# Patient Record
Sex: Female | Born: 1950 | Race: Black or African American | Hispanic: No | Smoking: Never smoker
Health system: Southern US, Community
[De-identification: ages and names within clinical notes are randomized; demographics above are authoritative.]

## PROBLEM LIST (undated history)

## (undated) DIAGNOSIS — E079 Disorder of thyroid, unspecified: Secondary | ICD-10-CM

## (undated) DIAGNOSIS — K219 Gastro-esophageal reflux disease without esophagitis: Secondary | ICD-10-CM

## (undated) DIAGNOSIS — Z86010 Personal history of colonic polyps: Secondary | ICD-10-CM

## (undated) HISTORY — DX: Personal history of colonic polyps: Z86.010

## (undated) HISTORY — PX: FOOT SURGERY: SHX648

## (undated) HISTORY — DX: Disorder of thyroid, unspecified: E07.9

## (undated) HISTORY — DX: Gastro-esophageal reflux disease without esophagitis: K21.9

## (undated) HISTORY — PX: OTHER SURGICAL HISTORY: SHX169

---

## 1997-12-12 ENCOUNTER — Encounter: Admission: RE | Admit: 1997-12-12 | Discharge: 1997-12-12 | Payer: Self-pay | Admitting: *Deleted

## 1999-06-11 ENCOUNTER — Encounter: Admission: RE | Admit: 1999-06-11 | Discharge: 1999-06-11 | Payer: Self-pay | Admitting: Family Medicine

## 1999-06-11 ENCOUNTER — Encounter: Payer: Self-pay | Admitting: Family Medicine

## 2000-10-15 ENCOUNTER — Encounter: Admission: RE | Admit: 2000-10-15 | Discharge: 2000-10-15 | Payer: Self-pay | Admitting: Family Medicine

## 2000-10-15 ENCOUNTER — Encounter: Payer: Self-pay | Admitting: Family Medicine

## 2001-09-30 ENCOUNTER — Encounter: Admission: RE | Admit: 2001-09-30 | Discharge: 2001-09-30 | Payer: Self-pay | Admitting: Family Medicine

## 2001-09-30 ENCOUNTER — Encounter: Payer: Self-pay | Admitting: Family Medicine

## 2009-04-12 ENCOUNTER — Emergency Department (HOSPITAL_BASED_OUTPATIENT_CLINIC_OR_DEPARTMENT_OTHER): Admission: EM | Admit: 2009-04-12 | Discharge: 2009-04-12 | Payer: Self-pay | Admitting: Emergency Medicine

## 2010-07-23 LAB — URINALYSIS, ROUTINE W REFLEX MICROSCOPIC
Bilirubin Urine: NEGATIVE
Glucose, UA: NEGATIVE mg/dL
Hgb urine dipstick: NEGATIVE
Ketones, ur: NEGATIVE mg/dL
Nitrite: NEGATIVE
Protein, ur: NEGATIVE mg/dL
Specific Gravity, Urine: 1.019 (ref 1.005–1.030)
Urobilinogen, UA: 1 mg/dL (ref 0.0–1.0)
pH: 7.5 (ref 5.0–8.0)

## 2010-07-23 LAB — URINE MICROSCOPIC-ADD ON

## 2010-07-23 LAB — URINE CULTURE: Colony Count: 5000

## 2011-04-23 HISTORY — PX: COLONOSCOPY: SHX174

## 2011-06-18 ENCOUNTER — Emergency Department (INDEPENDENT_AMBULATORY_CARE_PROVIDER_SITE_OTHER): Payer: BC Managed Care – PPO

## 2011-06-18 ENCOUNTER — Encounter (HOSPITAL_BASED_OUTPATIENT_CLINIC_OR_DEPARTMENT_OTHER): Payer: Self-pay | Admitting: *Deleted

## 2011-06-18 ENCOUNTER — Other Ambulatory Visit: Payer: Self-pay

## 2011-06-18 ENCOUNTER — Emergency Department (HOSPITAL_BASED_OUTPATIENT_CLINIC_OR_DEPARTMENT_OTHER)
Admission: EM | Admit: 2011-06-18 | Discharge: 2011-06-18 | Disposition: A | Discharge status: Home / Self Care | Payer: BC Managed Care – PPO | Attending: Emergency Medicine | Admitting: Emergency Medicine

## 2011-06-18 DIAGNOSIS — R091 Pleurisy: Secondary | ICD-10-CM

## 2011-06-18 DIAGNOSIS — R079 Chest pain, unspecified: Secondary | ICD-10-CM

## 2011-06-18 LAB — CARDIAC PANEL(CRET KIN+CKTOT+MB+TROPI)
CK, MB: 2.9 ng/mL (ref 0.3–4.0)
Total CK: 216 U/L — ABNORMAL HIGH (ref 7–177)
Troponin I: <0.3 ng/mL (ref <0.30)

## 2011-06-18 LAB — COMPREHENSIVE METABOLIC PANEL
ALT: 23 U/L (ref 0–35)
AST: 25 U/L (ref 0–37)
Alkaline Phosphatase: 60 U/L (ref 39–117)
CO2: 30 mEq/L (ref 19–32)
Calcium: 11.1 mg/dL — ABNORMAL HIGH (ref 8.4–10.5)
GFR calc Af Amer: >90 mL/min (ref >90)
Glucose, Bld: 85 mg/dL (ref 70–99)
Potassium: 4.2 mEq/L (ref 3.5–5.1)
Sodium: 139 mEq/L (ref 135–145)
Total Protein: 7.9 g/dL (ref 6.0–8.3)

## 2011-06-18 LAB — CBC
MCH: 29.3 pg (ref 26.0–34.0)
MCHC: 34 g/dL (ref 30.0–36.0)
MCV: 86.2 fL (ref 78.0–100.0)
Platelets: 278 10*3/uL (ref 150–400)
RDW: 12.9 % (ref 11.5–15.5)

## 2011-06-18 LAB — DIFFERENTIAL
Eosinophils Absolute: 0.1 10*3/uL (ref 0.0–0.7)
Metamyelocytes Relative: 0 %
Myelocytes: 0 %
Neutro Abs: 3.1 10*3/uL (ref 1.7–7.7)
Neutrophils Relative %: 48 % (ref 43–77)
Promyelocytes Absolute: 0 %
nRBC: 0 /100 WBC

## 2011-06-18 MED ORDER — ASPIRIN 81 MG PO CHEW
324.0000 mg | CHEWABLE_TABLET | Freq: Once | ORAL | Status: AC
  Administered 2011-06-18: 324 mg via ORAL
  Filled 2011-06-18: qty 1

## 2011-06-18 MED ORDER — OMEPRAZOLE 20 MG PO CPDR: 20.0000 mg | DELAYED_RELEASE_CAPSULE | Freq: Every day | ORAL | Status: DC

## 2011-06-18 MED ORDER — GI COCKTAIL ~~LOC~~
30.0000 mL | Freq: Once | ORAL | Status: AC
  Administered 2011-06-18: 30 mL via ORAL
  Filled 2011-06-18: qty 30

## 2011-06-18 NOTE — ED Notes (Signed)
Patient states she has had chest pain for the last 24 hours.  States she felt like she had indigestion and was burping a whole lot.  States this am she developed left neck and arm pain.

## 2011-06-18 NOTE — Discharge Instructions (Signed)
Chest Pain (Nonspecific) It is often hard to give a specific diagnosis for the cause of chest pain. There is always a chance that your pain could be related to something serious, such as a heart attack or a blood clot in the lungs. You need to follow up with your caregiver for further evaluation. CAUSES   Heartburn.   Pneumonia or bronchitis.   Anxiety and stress.   Inflammation around your heart (pericarditis) or lung (pleuritis or pleurisy).   A blood clot in the lung.   A collapsed lung (pneumothorax). It can develop suddenly on its own (spontaneous pneumothorax) or from injury (trauma) to the chest.  The chest wall is composed of bones, muscles, and cartilage. Any of these can be the source of the pain.  The bones can be bruised by injury.   The muscles or cartilage can be strained by coughing or overwork.   The cartilage can be affected by inflammation and become sore (costochondritis).  DIAGNOSIS  Lab tests or other studies, such as X-rays, an EKG, stress testing, or cardiac imaging, may be needed to find the cause of your pain.  TREATMENT   Treatment depends on what may be causing your chest pain. Treatment may include:   Acid blockers for heartburn.   Anti-inflammatory medicine.   Pain medicine for inflammatory conditions.   Antibiotics if an infection is present.   You may be advised to change lifestyle habits. This includes stopping smoking and avoiding caffeine and chocolate.   You may be advised to keep your head raised (elevated) when sleeping. This reduces the chance of acid going backward from your stomach into your esophagus.   Most of the time, nonspecific chest pain will improve within 2 to 3 days with rest and mild pain medicine.  HOME CARE INSTRUCTIONS   If antibiotics were prescribed, take the full amount even if you start to feel better.   For the next few days, avoid physical activities that bring on chest pain. Continue physical activities as  directed.   Do not smoke cigarettes or drink alcohol until your symptoms are gone.   Only take over-the-counter or prescription medicine for pain, discomfort, or fever as directed by your caregiver.   Follow your caregiver's suggestions for further testing if your chest pain does not go away.   Keep any follow-up appointments you made. If you do not go to an appointment, you could develop lasting (chronic) problems with pain. If there is any problem keeping an appointment, you must call to reschedule.  SEEK MEDICAL CARE IF:   You think you are having problems from the medicine you are taking. Read your medicine instructions carefully.   Your chest pain does not go away, even after treatment.   You develop a rash with blisters on your chest.  SEEK IMMEDIATE MEDICAL CARE IF:   You have increased chest pain or pain that spreads to your arm, neck, jaw, back, or belly (abdomen).   You develop shortness of breath, an increasing cough, or you are coughing up blood.   You have severe back or abdominal pain, feel sick to your stomach (nauseous) or throw up (vomit).   You develop severe weakness, fainting, or chills.   You have an oral temperature above 102 F (38.9 C), not controlled by medicine.  THIS IS AN EMERGENCY. Do not wait to see if the pain will go away. Get medical help at once. Call your local emergency services (911 in [place] Do not drive yourself to   the hospital. MAKE SURE YOU:   Understand these instructions.   Will watch your condition.   Will get help right away if you are not doing well or get worse.  Document Released: 01/16/2005 Document Revised: 12/19/2010 Document Reviewed: 11/12/2007 Exit[employer] Patient Information 2012 [employer]. 

## 2011-06-18 NOTE — ED Provider Notes (Signed)
History     CSN: [id number]  Arrival date & time 06/18/11  1324   First MD Initiated Contact with Patient 06/18/11 [phone]      Chief Complaint  Patient presents with  . Pleurisy    (Consider location/radiation/quality/duration/timing/severity/associated sxs/prior treatment) Patient is a 61 y.o. female presenting with chest pain. The history is provided by the patient.  Chest Pain The chest pain began yesterday. Chest pain occurs constantly. The chest pain is unchanged. Associated with: nothing. The severity of the pain is mild. The quality of the pain is described as pressure-like. The pain does not radiate. Exacerbated by: nothing. Associated symptoms comments: belching. She tried nothing for the symptoms. Risk factors include no known risk factors.     History reviewed. No pertinent past medical history.  Past Surgical History  Procedure Date  . Cesarean section     No family history on file.  History  Substance Use Topics  . Smoking status: Never Smoker   . Smokeless tobacco: Not on file  . Alcohol Use: No    OB History    Grav Para Term Preterm Abortions TAB SAB Ect Mult Living                  Review of Systems  Cardiovascular: Positive for chest pain.  All other systems reviewed and are negative.    Allergies  Review of patient's allergies indicates no known allergies.  Home Medications   Current Outpatient Rx  Name Route Sig Dispense Refill  . ONE-DAILY MULTI VITAMINS PO TABS Oral Take 1 tablet by mouth daily.      BP 129/86  Pulse 72  Temp 98.1 F (36.7 C)  Resp 20  Ht 5\' 4"  (1.626 m)  Wt 190 lb (86.183 kg)  BMI 32.61 kg/m2  SpO2 100%  Physical Exam  Nursing note and vitals reviewed. Constitutional: She is oriented to person, place, and time. She appears well-developed and well-nourished. No distress.  HENT:  Head: Normocephalic and atraumatic.  Neck: Normal range of motion. Neck supple.  Cardiovascular: Normal rate and regular rhythm.   Exam reveals no gallop and no friction rub.   No murmur heard.      There is ttp of the anterior chest wall which reproduces her symptoms.  Pulmonary/Chest: Effort normal and breath sounds normal. No respiratory distress. She has no wheezes.  Abdominal: Soft. Bowel sounds are normal. She exhibits no distension. There is no tenderness.  Musculoskeletal: Normal range of motion.  Neurological: She is alert and oriented to person, place, and time.  Skin: Skin is warm and dry. She is not diaphoretic.    ED Course  Procedures (including critical care time)   Labs Reviewed  CARDIAC PANEL(CRET KIN+CKTOT+MB+TROPI)  CBC  DIFFERENTIAL  COMPREHENSIVE METABOLIC PANEL  LIPASE, BLOOD  TROPONIN I   No results found.   No diagnosis found.   Date: 06/18/2011  Rate: 63  Rhythm: normal sinus rhythm  QRS Axis: left  Intervals: normal  ST/T Wave abnormalities: normal  Conduction Disutrbances:none  Narrative Interpretation:   Old EKG Reviewed: none available    MDM  The symptoms are atypical for cardiac pain, the enzymes and ekg are normal, and there are no risk factors for cardiac disease.  She is feeling better with GI cocktail, will discharge to home.  Return prn.          Geoffery Lyons, MD 06/18/11 1201

## 2011-10-07 ENCOUNTER — Ambulatory Visit (INDEPENDENT_AMBULATORY_CARE_PROVIDER_SITE_OTHER): Payer: BC Managed Care – PPO | Admitting: Family Medicine

## 2011-10-07 ENCOUNTER — Encounter: Payer: Self-pay | Admitting: *Deleted

## 2011-10-07 ENCOUNTER — Encounter: Payer: Self-pay | Admitting: Family Medicine

## 2011-10-07 VITALS — BP 115/75 | HR 75 | Temp 98.4°F | Ht 64.0 in | Wt 202.4 lb

## 2011-10-07 DIAGNOSIS — M799 Soft tissue disorder, unspecified: Secondary | ICD-10-CM

## 2011-10-07 DIAGNOSIS — E079 Disorder of thyroid, unspecified: Secondary | ICD-10-CM | POA: Insufficient documentation

## 2011-10-07 DIAGNOSIS — M7989 Other specified soft tissue disorders: Secondary | ICD-10-CM | POA: Insufficient documentation

## 2011-10-07 DIAGNOSIS — E669 Obesity, unspecified: Secondary | ICD-10-CM | POA: Insufficient documentation

## 2011-10-07 DIAGNOSIS — K219 Gastro-esophageal reflux disease without esophagitis: Secondary | ICD-10-CM | POA: Insufficient documentation

## 2011-10-07 DIAGNOSIS — Z23 Encounter for immunization: Secondary | ICD-10-CM

## 2011-10-07 LAB — HEPATIC FUNCTION PANEL
AST: 23 U/L (ref 0–37)
Albumin: 3.8 g/dL (ref 3.5–5.2)
Total Bilirubin: 0.4 mg/dL (ref 0.3–1.2)

## 2011-10-07 LAB — BASIC METABOLIC PANEL
BUN: 15 mg/dL (ref 6–23)
Calcium: 10.2 mg/dL (ref 8.4–10.5)
Creatinine, Ser: 0.8 mg/dL (ref 0.4–1.2)
GFR: 99.66 mL/min (ref >60.00)
Glucose, Bld: 78 mg/dL (ref 70–99)

## 2011-10-07 LAB — CBC WITH DIFFERENTIAL/PLATELET
Basophils Relative: 0.5 % (ref 0.0–3.0)
Eosinophils Absolute: 0.2 10*3/uL (ref 0.0–0.7)
Eosinophils Relative: 2.3 % (ref 0.0–5.0)
HCT: 35.1 % — ABNORMAL LOW (ref 36.0–46.0)
Lymphs Abs: 2.3 10*3/uL (ref 0.7–4.0)
MCHC: 33.2 g/dL (ref 30.0–36.0)
MCV: 89.4 fl (ref 78.0–100.0)
Monocytes Absolute: 0.6 10*3/uL (ref 0.1–1.0)
Neutro Abs: 3.9 10*3/uL (ref 1.4–7.7)
RBC: 3.92 Mil/uL (ref 3.87–5.11)
WBC: 7 10*3/uL (ref 4.5–10.5)

## 2011-10-07 LAB — LIPID PANEL
Cholesterol: 194 mg/dL (ref 0–200)
HDL: 45 mg/dL (ref >39.00)
Triglycerides: 47 mg/dL (ref 0.0–149.0)

## 2011-10-07 LAB — TSH: TSH: 1.63 u[IU]/mL (ref 0.35–5.50)

## 2011-10-07 MED ORDER — TETANUS-DIPHTH-ACELL PERTUSSIS 5-2.5-18.5 LF-MCG/0.5 IM SUSP
0.5000 mL | Freq: Once | INTRAMUSCULAR | Status: AC
  Administered 2011-10-07: 0.5 mL via INTRAMUSCULAR

## 2011-10-07 NOTE — Assessment & Plan Note (Signed)
New.  Pt's mass consistent w/ lipoma.  Not growing or changing- pt prefers watchful waiting to surgical referral at this time.

## 2011-10-07 NOTE — Assessment & Plan Note (Signed)
New to provider, chronic for pt.  Well controlled w/ dietary modifications and OTC prilosec prn.  Will follow.

## 2011-10-07 NOTE — Patient Instructions (Addendum)
Schedule your complete physical at your convenience- we'll do your pap We'll notify you of your lab results Keep up the good work on healthy diet and regular exercise Call with any questions or concerns Welcome!  We're glad to have you!

## 2011-10-07 NOTE — Assessment & Plan Note (Signed)
New to provider.  Pt reports years ago she was told that her thyroid was 'messed up'.  Has never been on meds, no recent labs.  Check labs s

## 2011-10-07 NOTE — Assessment & Plan Note (Signed)
New.  Pt's goal is to lose 40 lbs.  Has recently started to exercise.  Stressed importance of healthy diet.  Will follow.

## 2011-10-07 NOTE — Progress Notes (Signed)
  Subjective:    Patient ID: Deanna Livingston, female    DOB: [birth year], 61 y.o.   MRN: [id number]  HPI New to establish.  No recent PCP.  No GYN.  Thyroid disorder- pt reports that 'years ago' my 'thyroid was out of whack'.  Has not had recent lab work.  Denies current sxs- no fatigue, constipation, dry skin/hair/nails, cold intolerance.  GERD- chronic problem, only occurs w/ spicy foods.  Will take prilosec as needed.  Obesity- chronic problem for pt, pt wants to lose 40 lbs.  Pt is walking and doing elliptical.  Eats lots of fruits and veggies and drinks water.  Health maintenance- overdue on pap/mammo.  Has never had colonoscopy.  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  Not growing or changing.  Will occasionally cause pain.   Review of Systems     Objective:   Physical Exam  Vitals reviewed. Constitutional: She is oriented to person, place, and time. She appears well-developed and well-nourished. No distress.  HENT:  Head: Normocephalic and atraumatic.  Eyes: Conjunctivae and EOM are normal. Pupils are equal, round, and reactive to light.  Neck: Normal range of motion. Neck supple. No thyromegaly present.  Cardiovascular: Normal rate, regular rhythm, normal heart sounds and intact distal pulses.   No murmur heard. Pulmonary/Chest: Effort normal and breath sounds normal. No respiratory distress.  Abdominal: Soft. She exhibits no distension. There is no tenderness.  Musculoskeletal: She exhibits no edema (of lower extremities).       4-5 cm mobile, nontender soft tissue mass at L anterior glenohumeral joint  Lymphadenopathy:    She has no cervical adenopathy.  Neurological: She is alert and oriented to person, place, and time.  Skin: Skin is warm and dry.  Psychiatric: She has a normal mood and affect. Her behavior is normal.          Assessment & Plan:

## 2011-10-09 LAB — VITAMIN D 1,25 DIHYDROXY: Vitamin D 1, 25 (OH)2 Total: 111 pg/mL — ABNORMAL HIGH (ref 18–72)

## 2011-10-14 ENCOUNTER — Telehealth: Payer: Self-pay | Admitting: *Deleted

## 2011-10-14 NOTE — Telephone Encounter (Signed)
Pt states that she was advised to stop all Vitamin D supplement but would like to know if it is ok to take multivitamin because if does contain vitamin D. Per Dr Laury Axon ok for Pt to take multivitamin just no additional supplements of Vit-d. Pt ok info will just take the multivitamin.

## 2011-11-21 ENCOUNTER — Other Ambulatory Visit (HOSPITAL_COMMUNITY)
Admission: RE | Admit: 2011-11-21 | Discharge: 2011-11-21 | Disposition: A | Discharge status: Home / Self Care | Payer: BC Managed Care – PPO | Source: Ambulatory Visit | Attending: Family Medicine | Admitting: Family Medicine

## 2011-11-21 ENCOUNTER — Encounter: Payer: Self-pay | Admitting: Family Medicine

## 2011-11-21 ENCOUNTER — Ambulatory Visit (INDEPENDENT_AMBULATORY_CARE_PROVIDER_SITE_OTHER): Payer: BC Managed Care – PPO | Admitting: Family Medicine

## 2011-11-21 VITALS — BP 118/77 | HR 76 | Temp 97.9°F | Ht 64.0 in | Wt 200.4 lb

## 2011-11-21 DIAGNOSIS — Z780 Asymptomatic menopausal state: Secondary | ICD-10-CM

## 2011-11-21 DIAGNOSIS — Z1211 Encounter for screening for malignant neoplasm of colon: Secondary | ICD-10-CM

## 2011-11-21 DIAGNOSIS — Z124 Encounter for screening for malignant neoplasm of cervix: Secondary | ICD-10-CM

## 2011-11-21 DIAGNOSIS — Z01419 Encounter for gynecological examination (general) (routine) without abnormal findings: Secondary | ICD-10-CM | POA: Insufficient documentation

## 2011-11-21 DIAGNOSIS — Z1231 Encounter for screening mammogram for malignant neoplasm of breast: Secondary | ICD-10-CM

## 2011-11-21 NOTE — Patient Instructions (Addendum)
Follow up in 6 months to recheck your cholesterol Keep up the good work!  You look great! We'll call you with your mammo and bone density appts Someone will call you with your GI appt Call with any questions or concerns Enjoy the rest of your summer!!!

## 2011-11-21 NOTE — Progress Notes (Signed)
  Subjective:    Patient ID: Deanna Livingston, female    DOB: 1950/10/04, 61 y.o.   MRN: [id number]  HPI CPE- no concerns today.  Due for mammo, dexa, colonoscopy.   Review of Systems Patient reports no vision/ hearing changes, adenopathy,fever, weight change,  persistant/recurrent hoarseness , swallowing issues, chest pain, palpitations, edema, persistant/recurrent cough, hemoptysis, dyspnea (rest/exertional/paroxysmal nocturnal), gastrointestinal bleeding (melena, rectal bleeding), abdominal pain, significant heartburn, bowel changes, GU symptoms (dysuria, hematuria, incontinence), Gyn symptoms (abnormal  bleeding, pain),  syncope, focal weakness, memory loss, numbness & tingling, skin/hair/nail changes, abnormal bruising or bleeding, anxiety, or depression.     Objective:   Physical Exam  General Appearance:    Alert, cooperative, no distress, appears stated age  Head:    Normocephalic, without obvious abnormality, atraumatic  Eyes:    PERRL, conjunctiva/corneas clear, EOM's intact, fundi    benign, both eyes  Ears:    Normal TM's and external ear canals, both ears  Nose:   Nares normal, septum midline, mucosa normal, no drainage    or sinus tenderness  Throat:   Lips, mucosa, and tongue normal; teeth and gums normal  Neck:   Supple, symmetrical, trachea midline, no adenopathy;    Thyroid: no enlargement/tenderness/nodules  Back:     Symmetric, no curvature, ROM normal, no CVA tenderness  Lungs:     Clear to auscultation bilaterally, respirations unlabored  Chest Wall:    No tenderness or deformity   Heart:    Regular rate and rhythm, S1 and S2 normal, no murmur, rub   or gallop  Breast Exam:    No tenderness, masses, or nipple abnormality  Abdomen:     Soft, non-tender, bowel sounds active all four quadrants,    no masses, no organomegaly  Genitalia:    External genitalia normal, cervix normal in appearance, no CMT, uterus in normal size and position, adnexa w/out mass or  tenderness, mucosa pink and moist, no lesions or discharge present  Rectal:    Normal external appearance  Extremities:   Extremities normal, atraumatic, no cyanosis or edema  Pulses:   2+ and symmetric all extremities  Skin:   Skin color, texture, turgor normal, no rashes or lesions  Lymph nodes:   Cervical, supraclavicular, and axillary nodes normal  Neurologic:   CNII-XII intact, normal strength, sensation and reflexes    throughout          Assessment & Plan:

## 2011-11-22 ENCOUNTER — Encounter: Payer: Self-pay | Admitting: Internal Medicine

## 2011-11-26 ENCOUNTER — Encounter: Payer: Self-pay | Admitting: *Deleted

## 2011-12-03 NOTE — Assessment & Plan Note (Signed)
Pap collected. 

## 2011-12-03 NOTE — Assessment & Plan Note (Signed)
Pt's PE WNL.  Reviewed recent labs.  Refer for needed health maintenance.  Anticipatory guidance provided.

## 2011-12-04 ENCOUNTER — Ambulatory Visit: Payer: BC Managed Care – PPO

## 2011-12-04 ENCOUNTER — Other Ambulatory Visit: Payer: BC Managed Care – PPO

## 2011-12-13 ENCOUNTER — Telehealth: Payer: Self-pay | Admitting: Family Medicine

## 2011-12-13 NOTE — Telephone Encounter (Signed)
Caller: [name]/Patient; Patient Name: Deanna Livingston; PCP: Sheliah Hatch.; Best Callback Phone Number: [phone]; Reason for call: Headache; onset March 2013; has been having sporadic sharp pains to the right side of head, above the eyebrow; excercises and eats healthy; feels that headache today was the result of stress due to her husband; All emergent symptoms of Headache protocol ruled out except "verbalizes feeling stressed or tense and and no previous evaluation"; disposition see within 2 wks; care advice given and [name] will call back for appt

## 2012-01-02 ENCOUNTER — Ambulatory Visit (AMBULATORY_SURGERY_CENTER): Payer: BC Managed Care – PPO | Admitting: *Deleted

## 2012-01-02 ENCOUNTER — Encounter: Payer: Self-pay | Admitting: Internal Medicine

## 2012-01-02 VITALS — Ht 64.0 in | Wt 203.0 lb

## 2012-01-02 DIAGNOSIS — Z1211 Encounter for screening for malignant neoplasm of colon: Secondary | ICD-10-CM

## 2012-01-02 MED ORDER — NA SULFATE-K SULFATE-MG SULF 17.5-3.13-1.6 GM/177ML PO SOLN: ORAL | Status: DC

## 2012-01-07 ENCOUNTER — Ambulatory Visit
Admission: RE | Admit: 2012-01-07 | Discharge: 2012-01-07 | Disposition: A | Discharge status: Home / Self Care | Payer: BC Managed Care – PPO | Source: Ambulatory Visit | Attending: Family Medicine | Admitting: Family Medicine

## 2012-01-07 DIAGNOSIS — Z01419 Encounter for gynecological examination (general) (routine) without abnormal findings: Secondary | ICD-10-CM

## 2012-01-07 DIAGNOSIS — Z1231 Encounter for screening mammogram for malignant neoplasm of breast: Secondary | ICD-10-CM

## 2012-01-07 DIAGNOSIS — Z780 Asymptomatic menopausal state: Secondary | ICD-10-CM

## 2012-01-08 ENCOUNTER — Encounter: Payer: BC Managed Care – PPO | Admitting: Internal Medicine

## 2012-01-15 ENCOUNTER — Ambulatory Visit (AMBULATORY_SURGERY_CENTER): Payer: BC Managed Care – PPO | Admitting: Internal Medicine

## 2012-01-15 ENCOUNTER — Encounter: Payer: Self-pay | Admitting: Internal Medicine

## 2012-01-15 VITALS — BP 129/85 | HR 68 | Temp 98.7°F | Resp 16 | Ht 64.0 in | Wt 203.0 lb

## 2012-01-15 DIAGNOSIS — D126 Benign neoplasm of colon, unspecified: Secondary | ICD-10-CM

## 2012-01-15 DIAGNOSIS — Z1211 Encounter for screening for malignant neoplasm of colon: Secondary | ICD-10-CM

## 2012-01-15 MED ORDER — SODIUM CHLORIDE 0.9 % IV SOLN: 500.0000 mL | INTRAVENOUS | Status: DC

## 2012-01-15 NOTE — Op Note (Signed)
[facility] [address] [place], [address]   COLONOSCOPY PROCEDURE REPORT  PATIENT: Deanna Livingston, Deanna Livingston  MR#: [id number] BIRTHDATE: 08/05/1950 , 60  yrs. old GENDER: Female ENDOSCOPIST: Iva Boop, MD, [facility] REFERRED QM:VHQIONGEX Beverely Low, M.D. PROCEDURE DATE:  01/15/2012 PROCEDURE:   Colonoscopy with snare polypectomy ASA CLASS:   Class II INDICATIONS:average risk screening. MEDICATIONS: Propofol (Diprivan) 240 mg IV, MAC sedation, administered by CRNA, and These medications were titrated to patient response per physician's verbal order  DESCRIPTION OF PROCEDURE:   After the risks benefits and alternatives of the procedure were thoroughly explained, informed consent was obtained.  A digital rectal exam revealed no abnormalities of the rectum.   The LB [id number]  endoscope was introduced through the anus and advanced to the cecum, which was identified by both the appendix and ileocecal valve. No adverse events experienced.   The quality of the prep was Suprep excellent The instrument was then slowly withdrawn as the colon was fully examined.      COLON FINDINGS: Two polypoid shaped sessile polyps ranging between 3-45mm in size were found at the cecum and in the sigmoid colon.  A polypectomy was performed with a cold snare.  The resection was complete and the polyp tissue was partially retrieved.   The colon mucosa was otherwise normal.  Retroflexed views revealed no abnormalities. The time to cecum=3 minutes 12 seconds.  Withdrawal time=8 minutes 50 seconds.  The scope was withdrawn and the procedure completed. COMPLICATIONS: There were no complications.  ENDOSCOPIC IMPRESSION: 1.   Two sessile polyps ranging between 3-25mm in size were found at the cecum and in the sigmoid colon; polypectomy was performed with a cold snare 5 mm cecal polyp recovered, 3 mm sigmoid polyp not recovered. 2.   The colon mucosa was otherwise normal excellent  prep  RECOMMENDATIONS: Timing of repeat colonoscopy will be determined by pathology findings.   eSigned:  Iva Boop, MD, [facility] 01/15/2012 11:44 AM   cc: Neena Rhymes MD and The Patient

## 2012-01-15 NOTE — Patient Instructions (Addendum)
Two small polyps were removed. One of them was not picked up but it was tiny and looked benign.  i will let you know about the pathology results and recommendations by sending a letter.  Thank you for choosing me and [facility].  Iva Boop, MD, FACG   YOU HAD AN ENDOSCOPIC PROCEDURE TODAY AT THE [facility]: Refer to the procedure report that was given to you for any specific questions about what was found during the examination.  If the procedure report does not answer your questions, please call your gastroenterologist to clarify.  If you requested that your care partner not be given the details of your procedure findings, then the procedure report has been included in a sealed envelope for you to review at your convenience later.  YOU SHOULD EXPECT: Some feelings of bloating in the abdomen. Passage of more gas than usual.  Walking can help get rid of the air that was put into your GI tract during the procedure and reduce the bloating. If you had a lower endoscopy (such as a colonoscopy or flexible sigmoidoscopy) you may notice spotting of blood in your stool or on the toilet paper. If you underwent a bowel prep for your procedure, then you may not have a normal bowel movement for a few days.  DIET: Your first meal following the procedure should be a light meal and then it is ok to progress to your normal diet.  A half-sandwich or bowl of soup is an example of a good first meal.  Heavy or fried foods are harder to digest and may make you feel nauseous or bloated.  Likewise meals heavy in dairy and vegetables can cause extra gas to form and this can also increase the bloating.  Drink plenty of fluids but you should avoid alcoholic beverages for 24 hours.  ACTIVITY: Your care partner should take you home directly after the procedure.  You should plan to take it easy, moving slowly for the rest of the day.  You can resume normal activity the day after the procedure  however you should NOT DRIVE or use heavy machinery for 24 hours (because of the sedation medicines used during the test).    SYMPTOMS TO REPORT IMMEDIATELY: A gastroenterologist can be reached at any hour.  During normal business hours, 8:30 AM to 5:00 PM Monday through Friday, call [phone].  After hours and on weekends, please call the GI answering service at [phone] who will take a message and have the physician on call contact you.   Following lower endoscopy (colonoscopy or flexible sigmoidoscopy):  Excessive amounts of blood in the stool  Significant tenderness or worsening of abdominal pains  Swelling of the abdomen that is new, acute  Fever of 100F or higher   FOLLOW UP: If any biopsies were taken you will be contacted by phone or by letter within the next 1-3 weeks.  Call your gastroenterologist if you have not heard about the biopsies in 3 weeks.  Our staff will call the home number listed on your records the next business day following your procedure to check on you and address any questions or concerns that you may have at that time regarding the information given to you following your procedure. This is a courtesy call and so if there is no answer at the home number and we have not heard from you through the emergency physician on call, we will assume that you have returned to your regular daily  activities without incident.  SIGNATURES/CONFIDENTIALITY: You and/or your care partner have signed paperwork which will be entered into your electronic medical record.  These signatures attest to the fact that that the information above on your After Visit Summary has been reviewed and is understood.  Full responsibility of the confidentiality of this discharge information lies with you and/or your care-partner.  

## 2012-01-15 NOTE — Progress Notes (Signed)
Patient with preoperative order for IV antibiotic SSI prophylaxis, antibiotic initiated on time. (G8916)  Patient did not experience any of the following events: a burn prior to discharge; a fall within the facility; wrong site/side/patient/procedure/implant event; or a hospital transfer or hospital admission upon discharge from the facility. (G8907)  

## 2012-01-16 ENCOUNTER — Telehealth: Payer: Self-pay | Admitting: *Deleted

## 2012-01-16 NOTE — Telephone Encounter (Signed)
  Follow up Call-  Call back number 01/15/2012  Post procedure Call Back phone  # [phone]  Permission to leave phone message Yes     Patient questions:  Do you have a fever, pain , or abdominal swelling? no Pain Score  0 *  Have you tolerated food without any problems? yes  Have you been able to return to your normal activities? yes  Do you have any questions about your discharge instructions: Diet   no Medications  no Follow up visit  no  Do you have questions or concerns about your Care? no  Actions: * If pain score is 4 or above: No action needed, pain <4.

## 2012-01-17 ENCOUNTER — Encounter: Payer: Self-pay | Admitting: Family Medicine

## 2012-01-21 ENCOUNTER — Encounter: Payer: Self-pay | Admitting: Internal Medicine

## 2012-01-21 DIAGNOSIS — Z860100 Personal history of colon polyps, unspecified: Secondary | ICD-10-CM

## 2012-01-21 HISTORY — DX: Personal history of colon polyps, unspecified: Z86.0100

## 2012-01-21 HISTORY — DX: Personal history of colonic polyps: Z86.010

## 2012-01-21 NOTE — Progress Notes (Signed)
Quick Note:  5 mm tubular adenoma Repeat colonoscopy about 01/2017 ______

## 2013-02-05 ENCOUNTER — Other Ambulatory Visit: Payer: Self-pay

## 2013-02-05 DIAGNOSIS — Z1231 Encounter for screening mammogram for malignant neoplasm of breast: Secondary | ICD-10-CM

## 2013-03-08 ENCOUNTER — Ambulatory Visit
Admission: RE | Admit: 2013-03-08 | Discharge: 2013-03-08 | Disposition: A | Discharge status: Home / Self Care | Payer: BC Managed Care – PPO | Source: Ambulatory Visit

## 2013-03-08 DIAGNOSIS — Z1231 Encounter for screening mammogram for malignant neoplasm of breast: Secondary | ICD-10-CM

## 2013-04-28 ENCOUNTER — Telehealth: Payer: Self-pay

## 2013-04-28 NOTE — Telephone Encounter (Signed)
Medication and allergies:  Reviewed and updated  90 day supply/mail order: na Local pharmacy: [facility]   Immunizations due:  Declines flu vaccine  A/P:   No changes to FH or PSH or personal hx Pap--11/2011--neg MMG--02/2013--neg Bone Density--12/2011 [facility]--12/2011--Dr Gessner--adenomatous polyps--next due 2018  To Discuss with Provider: Splinter in right great toe since September

## 2013-04-29 ENCOUNTER — Ambulatory Visit (INDEPENDENT_AMBULATORY_CARE_PROVIDER_SITE_OTHER): Payer: BC Managed Care – PPO | Admitting: Family Medicine

## 2013-04-29 ENCOUNTER — Encounter: Payer: Self-pay | Admitting: Family Medicine

## 2013-04-29 VITALS — BP 126/80 | HR 60 | Temp 98.1°F | Resp 16 | Ht 63.5 in | Wt 193.0 lb

## 2013-04-29 DIAGNOSIS — M21611 Bunion of right foot: Secondary | ICD-10-CM

## 2013-04-29 DIAGNOSIS — M21619 Bunion of unspecified foot: Secondary | ICD-10-CM

## 2013-04-29 DIAGNOSIS — Z0000 Encounter for general adult medical examination without abnormal findings: Secondary | ICD-10-CM

## 2013-04-29 LAB — HEPATIC FUNCTION PANEL
ALT: 18 U/L (ref 0–35)
AST: 21 U/L (ref 0–37)
Albumin: 3.9 g/dL (ref 3.5–5.2)
Alkaline Phosphatase: 64 U/L (ref 39–117)
BILIRUBIN DIRECT: 0.1 mg/dL (ref 0.0–0.3)
BILIRUBIN TOTAL: 0.9 mg/dL (ref 0.3–1.2)
Total Protein: 7.4 g/dL (ref 6.0–8.3)

## 2013-04-29 LAB — BASIC METABOLIC PANEL
BUN: 8 mg/dL (ref 6–23)
CO2: 29 mEq/L (ref 19–32)
Calcium: 10.1 mg/dL (ref 8.4–10.5)
Chloride: 105 mEq/L (ref 96–112)
Creatinine, Ser: 0.8 mg/dL (ref 0.4–1.2)
GFR: 94.81 mL/min (ref >60.00)
Glucose, Bld: 79 mg/dL (ref 70–99)
POTASSIUM: 3.7 meq/L (ref 3.5–5.1)
Sodium: 140 mEq/L (ref 135–145)

## 2013-04-29 LAB — CBC WITH DIFFERENTIAL/PLATELET
BASOS PCT: 0.3 % (ref 0.0–3.0)
Basophils Absolute: 0 10*3/uL (ref 0.0–0.1)
EOS PCT: 1.9 % (ref 0.0–5.0)
Eosinophils Absolute: 0.2 10*3/uL (ref 0.0–0.7)
HCT: 35.8 % — ABNORMAL LOW (ref 36.0–46.0)
Hemoglobin: 12.1 g/dL (ref 12.0–15.0)
LYMPHS PCT: 32.7 % (ref 12.0–46.0)
Lymphs Abs: 2.7 10*3/uL (ref 0.7–4.0)
MCHC: 33.9 g/dL (ref 30.0–36.0)
MCV: 88 fl (ref 78.0–100.0)
Monocytes Absolute: 0.7 10*3/uL (ref 0.1–1.0)
Monocytes Relative: 8.9 % (ref 3.0–12.0)
NEUTROS PCT: 56.2 % (ref 43.0–77.0)
Neutro Abs: 4.6 10*3/uL (ref 1.4–7.7)
Platelets: 274 10*3/uL (ref 150.0–400.0)
RBC: 4.07 Mil/uL (ref 3.87–5.11)
RDW: 13.3 % (ref 11.5–14.6)
WBC: 8.2 10*3/uL (ref 4.5–10.5)

## 2013-04-29 LAB — LIPID PANEL
Cholesterol: 202 mg/dL — ABNORMAL HIGH (ref 0–200)
HDL: 35 mg/dL — AB (ref >39.00)
Total CHOL/HDL Ratio: 6
Triglycerides: 94 mg/dL (ref 0.0–149.0)
VLDL: 18.8 mg/dL (ref 0.0–40.0)

## 2013-04-29 LAB — LDL CHOLESTEROL, DIRECT: Direct LDL: 127.2 mg/dL

## 2013-04-29 LAB — TSH: TSH: 1.82 u[IU]/mL (ref 0.35–5.50)

## 2013-04-29 NOTE — Patient Instructions (Signed)
Follow up in 1 year or as needed Keep up the good work- you look great! We'll notify you of your lab results and make any changes if needed We'll call you with your podiatry appt Call with any questions or concerns Happy New Year!!

## 2013-04-29 NOTE — Progress Notes (Signed)
Pre visit review using our clinic review tool, if applicable. No additional management support is needed unless otherwise documented below in the visit note. 

## 2013-04-29 NOTE — Assessment & Plan Note (Signed)
Pt's PE WNL w/ exception of R bunion.  UTD on pap, mammo, DEXA, colonoscopy.  Check labs.  Anticipatory guidance provided.

## 2013-04-29 NOTE — Progress Notes (Signed)
   Subjective:    Patient ID: Deanna Livingston, female    DOB: 02-20-1951, 63 y.o.   MRN: [id number]  HPI CPE- no concerns today w/ exception of splinter in toe.  L big toe, foreign body since 9/22.  Painful to walk on.  R bunion- growing, difficult to find shoes, not painful.   Review of Systems Patient reports no vision/ hearing changes, adenopathy,fever, weight change,  persistant/recurrent hoarseness , swallowing issues, chest pain, palpitations, edema, persistant/recurrent cough, hemoptysis, dyspnea (rest/exertional/paroxysmal nocturnal), gastrointestinal bleeding (melena, rectal bleeding), abdominal pain, significant heartburn, bowel changes, GU symptoms (dysuria, hematuria, incontinence), Gyn symptoms (abnormal  bleeding, pain),  syncope, focal weakness, memory loss, numbness & tingling, skin/hair/nail changes, abnormal bruising or bleeding, anxiety, or depression.     Objective:   Physical Exam General Appearance:    Alert, cooperative, no distress, appears stated age  Head:    Normocephalic, without obvious abnormality, atraumatic  Eyes:    PERRL, conjunctiva/corneas clear, EOM's intact, fundi    benign, both eyes  Ears:    Normal TM's and external ear canals, both ears  Nose:   Nares normal, septum midline, mucosa normal, no drainage    or sinus tenderness  Throat:   Lips, mucosa, and tongue normal; teeth and gums normal  Neck:   Supple, symmetrical, trachea midline, no adenopathy;    Thyroid: no enlargement/tenderness/nodules  Back:     Symmetric, no curvature, ROM normal, no CVA tenderness  Lungs:     Clear to auscultation bilaterally, respirations unlabored  Chest Wall:    No tenderness or deformity   Heart:    Regular rate and rhythm, S1 and S2 normal, no murmur, rub   or gallop  Breast Exam:    Deferred to mammo  Abdomen:     Soft, non-tender, bowel sounds active all four quadrants,    no masses, no organomegaly  Genitalia:    Deferred  Rectal:    Extremities:    Extremities normal, atraumatic, no cyanosis or edema.  Bunion on R foot  Pulses:   2+ and symmetric all extremities  Skin:   Skin color, texture, turgor normal, no rashes or lesions  Lymph nodes:   Cervical, supraclavicular, and axillary nodes normal  Neurologic:   CNII-XII intact, normal strength, sensation and reflexes    throughout          Assessment & Plan:

## 2013-04-30 ENCOUNTER — Encounter: Payer: Self-pay | Admitting: General Practice

## 2013-05-03 ENCOUNTER — Encounter: Payer: Self-pay | Admitting: General Practice

## 2013-05-03 LAB — VITAMIN D 1,25 DIHYDROXY
VITAMIN D3 1, 25 (OH): 73 pg/mL
Vitamin D 1, 25 (OH)2 Total: 73 pg/mL — ABNORMAL HIGH (ref 18–72)
Vitamin D2 1, 25 (OH)2: <8 pg/mL

## 2013-05-27 ENCOUNTER — Encounter: Payer: Self-pay | Admitting: Podiatry

## 2013-05-27 ENCOUNTER — Ambulatory Visit (INDEPENDENT_AMBULATORY_CARE_PROVIDER_SITE_OTHER): Payer: BC Managed Care – PPO

## 2013-05-27 ENCOUNTER — Ambulatory Visit (INDEPENDENT_AMBULATORY_CARE_PROVIDER_SITE_OTHER): Payer: BC Managed Care – PPO | Admitting: Podiatry

## 2013-05-27 VITALS — BP 133/81 | HR 71 | Resp 16 | Ht 63.5 in | Wt 190.0 lb

## 2013-05-27 DIAGNOSIS — M79676 Pain in unspecified toe(s): Secondary | ICD-10-CM

## 2013-05-27 DIAGNOSIS — M2010 Hallux valgus (acquired), unspecified foot: Secondary | ICD-10-CM

## 2013-05-27 DIAGNOSIS — M79609 Pain in unspecified limb: Secondary | ICD-10-CM

## 2013-05-27 DIAGNOSIS — L923 Foreign body granuloma of the skin and subcutaneous tissue: Secondary | ICD-10-CM

## 2013-05-27 DIAGNOSIS — M779 Enthesopathy, unspecified: Secondary | ICD-10-CM

## 2013-05-27 MED ORDER — TRIAMCINOLONE ACETONIDE 10 MG/ML IJ SUSP
10.0000 mg | Freq: Once | INTRAMUSCULAR | Status: AC
  Administered 2013-05-27: 10 mg

## 2013-05-27 NOTE — Progress Notes (Signed)
Subjective:     Patient ID: Deanna Livingston, female   DOB: October 10, 1950, 63 y.o.   MRN: [id number]  Foot Pain   patient presents stating I have a painful bunion on my right foot that is increasingly hard for me to wear shoes with and I think I did something to my left big toe and I might have a foreign body. States it's been present for about 3 months   Review of Systems  All other systems reviewed and are negative.       Objective:   Physical Exam  Nursing note and vitals reviewed. Constitutional: She is oriented to person, place, and time.  Cardiovascular: Intact distal pulses.   Musculoskeletal: Normal range of motion.  Neurological: She is oriented to person, place, and time.  Skin: Skin is warm.   neurovascular status is intact with muscle strength is adequate and no equinus condition noted. 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 I do think due to her age and her having to take care of her husband who is disabled she would do better with a distal osteotomy right even though we will not be able to get complete correction of the deformity and she is aware of this and wants to pursue that option in the next 6 weeks. For the left foot I infiltrated 60 mg Xylocaine Marcaine mixture and debrided tissue getting out several small irritation areas with no indications of a pure foreign body I then did a plantar interphalangeal injection to milligrams Kenalog, Xylocaine and advised on soaks reappoint her recheck again in 2 weeks

## 2013-05-27 NOTE — Progress Notes (Signed)
   Subjective:    Patient ID: Deanna Livingston, female    DOB: 1950/07/07, 63 y.o.   MRN: [id number]  HPI Comments: "I have a splinter or something in this toe"  Patient states that she think she has splinter in plantar hallux left since January 11, 2013. Says that the callus grew over it. Says its sharp when walking.  Also, pt states that she has bunion on the right foot for several months. Uncomfortable with shoes. Tries to wear wider shoes.     Review of Systems  All other systems reviewed and are negative.       Objective:   Physical Exam        Assessment & Plan:

## 2013-05-27 NOTE — Patient Instructions (Signed)

## 2013-06-10 ENCOUNTER — Encounter: Payer: Self-pay | Admitting: Podiatry

## 2013-06-10 ENCOUNTER — Ambulatory Visit (INDEPENDENT_AMBULATORY_CARE_PROVIDER_SITE_OTHER): Payer: BC Managed Care – PPO | Admitting: Podiatry

## 2013-06-10 VITALS — BP 140/98 | HR 75 | Resp 16

## 2013-06-10 DIAGNOSIS — M779 Enthesopathy, unspecified: Secondary | ICD-10-CM

## 2013-06-10 DIAGNOSIS — M2010 Hallux valgus (acquired), unspecified foot: Secondary | ICD-10-CM

## 2013-06-10 NOTE — Progress Notes (Signed)
Subjective:     Patient ID: Deanna Livingston, female   DOB: 19-Jun-1950, 63 y.o.   MRN: [id number]  HPI patient states my left foot feels much better and I am ready to get the bunion fixed on my right foot   Review of Systems     Objective:   Physical Exam Neurovascular status intact with no changes in health history noted and hyperostosis medial aspect first metatarsal head right foot that painful and red when pressed. 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 We'll take 6 months to one year. Patient wants surgery and understands all risks signs consent form in his dispensed air fracture walker for surgery today surgery scheduled for next several weeks

## 2013-06-10 NOTE — Patient Instructions (Signed)
Pre-Operative Instructions  Congratulations, you have decided to take an important step to improving your quality of life.  You can be assured that the doctors of [facility] will be with you every step of the way.  1. Plan to be at the surgery center/hospital at least 1 (one) hour prior to your scheduled time unless otherwise directed by the surgical center/hospital staff.  You must have a responsible adult accompany you, remain during the surgery and drive you home.  Make sure you have directions to the surgical center/hospital and know how to get there on time. 2. For hospital based surgery you will need to obtain a history and physical form from your family physician within 1 month prior to the date of surgery- we will give you a form for you primary physician.  3. We make every effort to accommodate the date you request for surgery.  There are however, times where surgery dates or times have to be moved.  We will contact you as soon as possible if a change in schedule is required.   4. No Aspirin/Ibuprofen for one week before surgery.  If you are on aspirin, any non-steroidal anti-inflammatory medications (Mobic, Aleve, Ibuprofen) you should stop taking it 7 days prior to your surgery.  You make take Tylenol  For pain prior to surgery.  5. Medications- If you are taking daily heart and blood pressure medications, seizure, reflux, allergy, asthma, anxiety, pain or diabetes medications, make sure the surgery center/hospital is aware before the day of surgery so they may notify you which medications to take or avoid the day of surgery. 6. No food or drink after midnight the night before surgery unless directed otherwise by surgical center/hospital staff. 7. No alcoholic beverages 24 hours prior to surgery.  No smoking 24 hours prior to or 24 hours after surgery. 8. Wear loose pants or shorts- loose enough to fit over bandages, boots, and casts. 9. No slip on shoes, sneakers are best. 10. Bring  your boot with you to the surgery center/hospital.  Also bring crutches or a walker if your physician has prescribed it for you.  If you do not have this equipment, it will be provided for you after surgery. 11. If you have not been contracted by the surgery center/hospital by the day before your surgery, call to confirm the date and time of your surgery. 12. Leave-time from work may vary depending on the type of surgery you have.  Appropriate arrangements should be made prior to surgery with your employer. 13. Prescriptions will be provided immediately following surgery by your doctor.  Have these filled as soon as possible after surgery and take the medication as directed. 14. Remove nail polish on the operative foot. 15. Wash the night before surgery.  The night before surgery wash the foot and leg well with the antibacterial soap provided and water paying special attention to beneath the toenails and in between the toes.  Rinse thoroughly with water and dry well with a towel.  Perform this wash unless told not to do so by your physician.  Enclosed: 1 Ice pack (please put in freezer the night before surgery)   1 Hibiclens skin cleaner   Pre-op Instructions  If you have any questions regarding the instructions, do not hesitate to call our office.  [place]: [address] [place], [place] [address] [phone]  [place]: [address] [place], [place] [address] [phone]  [place]: [address]  [place],  [address] [phone]  Dr. Richard   Tuchman DPM, Dr. Norman Regal DPM Dr. Richard Sikora DPM, Dr. M. Todd Hyatt DPM, Dr. Kathryn Egerton DPM 

## 2013-07-06 ENCOUNTER — Encounter: Payer: Self-pay | Admitting: Podiatry

## 2013-07-06 DIAGNOSIS — M2010 Hallux valgus (acquired), unspecified foot: Secondary | ICD-10-CM

## 2013-07-12 ENCOUNTER — Ambulatory Visit (INDEPENDENT_AMBULATORY_CARE_PROVIDER_SITE_OTHER): Payer: BC Managed Care – PPO

## 2013-07-12 ENCOUNTER — Ambulatory Visit (INDEPENDENT_AMBULATORY_CARE_PROVIDER_SITE_OTHER): Payer: BC Managed Care – PPO | Admitting: Podiatry

## 2013-07-12 ENCOUNTER — Encounter: Payer: Self-pay | Admitting: Podiatry

## 2013-07-12 ENCOUNTER — Encounter: Payer: BC Managed Care – PPO | Admitting: Podiatry

## 2013-07-12 VITALS — BP 140/86 | HR 69 | Resp 19 | Ht 63.5 in | Wt 189.0 lb

## 2013-07-12 DIAGNOSIS — Z9889 Other specified postprocedural states: Secondary | ICD-10-CM

## 2013-07-12 NOTE — Progress Notes (Signed)
   Subjective:    Patient ID: Deanna Livingston, female    DOB: December 01, 1950, 63 y.o.   MRN: [id number] Pt presents for POV1, right foot is slightly swollen, but not red. HPI    Review of Systems     Objective:   Physical Exam        Assessment & Plan:

## 2013-07-12 NOTE — Progress Notes (Deleted)
   Subjective:    Patient ID: Deanna Livingston, female    DOB: 1950/06/22, 63 y.o.   MRN: [id number]  HPI    Review of Systems     Objective:   Physical Exam        Assessment & Plan:

## 2013-07-13 NOTE — Progress Notes (Signed)
Subjective:     Patient ID: Deanna Livingston, female   DOB: 12-31-1950, 63 y.o.   MRN: [id number]  HPI patient states her right foot is doing fine after surgery. States that when she walks it does become achy and still swollen but she is having no other problems   Review of Systems     Objective:   Physical Exam Neurovascular status intact with well healing first metatarsal site right with wound edges well coapted and hallux in rectus position. Range of motion adequate with no crepitus of the joint noted    Assessment:     Healing well from structural Austin osteotomy right foot    Plan:     X-ray reviewed with patient and advised on continued immobilization and range of motion exercises. Dispense surgical shoe with instructions

## 2013-07-28 NOTE — Progress Notes (Signed)
Dr Paulla Dolly performed - [place] bunionectomy with pins fixation of the right foot.  Dr Paulla Dolly ordered Demerol 50mg  #35 1 or 2 tablets elvery 4 to 6 hours prn foot, and Phenergan 25mg  #35 1 to 2 tablets every 4 to 6 hours prn nausea and vomiting.

## 2013-08-09 ENCOUNTER — Encounter: Payer: Self-pay | Admitting: Podiatry

## 2013-08-09 ENCOUNTER — Ambulatory Visit (INDEPENDENT_AMBULATORY_CARE_PROVIDER_SITE_OTHER): Payer: BC Managed Care – PPO

## 2013-08-09 ENCOUNTER — Ambulatory Visit (INDEPENDENT_AMBULATORY_CARE_PROVIDER_SITE_OTHER): Payer: BC Managed Care – PPO | Admitting: Podiatry

## 2013-08-09 VITALS — BP 106/66 | HR 73 | Resp 16

## 2013-08-09 DIAGNOSIS — M2010 Hallux valgus (acquired), unspecified foot: Secondary | ICD-10-CM

## 2013-08-09 DIAGNOSIS — R609 Edema, unspecified: Secondary | ICD-10-CM

## 2013-08-09 NOTE — Progress Notes (Signed)
Subjective:     Patient ID: Deanna Livingston, female   DOB: [birth year], 63 y.o.   MRN: [id number]  HPI patient states that she's doing pretty well but still swelling and cannot fit into her regular shoes area able to walk without significant discomfort at this time   Review of Systems     Objective:   Physical Exam Neurovascular status intact negative Homans sign noted with well healing surgical site first MPJ right with mild to moderate edema around the first metatarsal    Assessment:     Continued healing from osteotomy 6 weeks ago with reasonable healing occurring and mild to moderate edema    Plan:     Reviewed x-rays and discussed continued compression anti-inflammatories and gradual return to saw shoe gear. Dispensed anklet with instructions on usage

## 2013-08-24 ENCOUNTER — Encounter: Payer: Self-pay | Admitting: Podiatry

## 2013-09-20 ENCOUNTER — Ambulatory Visit (INDEPENDENT_AMBULATORY_CARE_PROVIDER_SITE_OTHER): Payer: BC Managed Care – PPO

## 2013-09-20 ENCOUNTER — Ambulatory Visit: Payer: BC Managed Care – PPO | Admitting: Podiatry

## 2013-09-20 ENCOUNTER — Ambulatory Visit (INDEPENDENT_AMBULATORY_CARE_PROVIDER_SITE_OTHER): Payer: BC Managed Care – PPO | Admitting: Podiatry

## 2013-09-20 DIAGNOSIS — M2010 Hallux valgus (acquired), unspecified foot: Secondary | ICD-10-CM

## 2013-09-20 DIAGNOSIS — R609 Edema, unspecified: Secondary | ICD-10-CM

## 2013-09-20 NOTE — Progress Notes (Signed)
Subjective:     Patient ID: Deanna Livingston, female   DOB: Sep 19, 1950, 63 y.o.   MRN: [id number]  HPI patient states that the right foot is healing well and she is walking good with minimal discomfort and mild edema   Review of Systems     Objective:   Physical Exam Neurovascular status intact with no changes in health history and well-healing surgical site right first metatarsal with good range of motion and no crepitus within the joint    Assessment:     Healing well post bunionectomy right    Plan:     X-ray reviewed and advised on continued range of motion exercises anti-inflammatories and reappoint as needed

## 2014-02-07 ENCOUNTER — Other Ambulatory Visit: Payer: Self-pay

## 2014-02-07 DIAGNOSIS — Z1239 Encounter for other screening for malignant neoplasm of breast: Secondary | ICD-10-CM

## 2014-03-10 ENCOUNTER — Ambulatory Visit
Admission: RE | Admit: 2014-03-10 | Discharge: 2014-03-10 | Disposition: A | Discharge status: Home / Self Care | Payer: BC Managed Care – PPO | Source: Ambulatory Visit

## 2014-03-10 DIAGNOSIS — Z1239 Encounter for other screening for malignant neoplasm of breast: Secondary | ICD-10-CM

## 2014-10-07 ENCOUNTER — Encounter (HOSPITAL_BASED_OUTPATIENT_CLINIC_OR_DEPARTMENT_OTHER): Payer: Self-pay

## 2014-10-07 ENCOUNTER — Emergency Department (HOSPITAL_BASED_OUTPATIENT_CLINIC_OR_DEPARTMENT_OTHER): Payer: BC Managed Care – PPO

## 2014-10-07 ENCOUNTER — Emergency Department (HOSPITAL_BASED_OUTPATIENT_CLINIC_OR_DEPARTMENT_OTHER)
Admission: EM | Admit: 2014-10-07 | Discharge: 2014-10-07 | Disposition: A | Discharge status: Home / Self Care | Payer: BC Managed Care – PPO | Attending: Emergency Medicine | Admitting: Emergency Medicine

## 2014-10-07 DIAGNOSIS — Y9389 Activity, other specified: Secondary | ICD-10-CM | POA: Insufficient documentation

## 2014-10-07 DIAGNOSIS — Z79899 Other long term (current) drug therapy: Secondary | ICD-10-CM | POA: Insufficient documentation

## 2014-10-07 DIAGNOSIS — Z86018 Personal history of other benign neoplasm: Secondary | ICD-10-CM | POA: Diagnosis not present

## 2014-10-07 DIAGNOSIS — S90111A Contusion of right great toe without damage to nail, initial encounter: Secondary | ICD-10-CM | POA: Insufficient documentation

## 2014-10-07 DIAGNOSIS — Y998 Other external cause status: Secondary | ICD-10-CM | POA: Insufficient documentation

## 2014-10-07 DIAGNOSIS — Z8639 Personal history of other endocrine, nutritional and metabolic disease: Secondary | ICD-10-CM | POA: Diagnosis not present

## 2014-10-07 DIAGNOSIS — K219 Gastro-esophageal reflux disease without esophagitis: Secondary | ICD-10-CM | POA: Insufficient documentation

## 2014-10-07 DIAGNOSIS — Y92830 Public park as the place of occurrence of the external cause: Secondary | ICD-10-CM | POA: Diagnosis not present

## 2014-10-07 DIAGNOSIS — S99921A Unspecified injury of right foot, initial encounter: Secondary | ICD-10-CM | POA: Diagnosis present

## 2014-10-07 DIAGNOSIS — S90129A Contusion of unspecified lesser toe(s) without damage to nail, initial encounter: Secondary | ICD-10-CM

## 2014-10-07 MED ORDER — IBUPROFEN 400 MG PO TABS
600.0000 mg | ORAL_TABLET | Freq: Once | ORAL | Status: AC
  Administered 2014-10-07: 600 mg via ORAL
  Filled 2014-10-07 (×2): qty 1

## 2014-10-07 NOTE — ED Notes (Signed)
Patient returned from X-ray 
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
## 2014-10-07 NOTE — Discharge Instructions (Signed)
Contusion A contusion is a deep bruise. Contusions are the result of an injury that caused bleeding under the skin. The contusion may turn blue, purple, or yellow. Minor injuries will give you a painless contusion, but more severe contusions may stay painful and swollen for a few weeks.  CAUSES  A contusion is usually caused by a blow, trauma, or direct force to an area of the body. SYMPTOMS   Swelling and redness of the injured area.  Bruising of the injured area.  Tenderness and soreness of the injured area.  Pain. DIAGNOSIS  The diagnosis can be made by taking a history and physical exam. An X-ray, CT scan, or MRI may be needed to determine if there were any associated injuries, such as fractures. TREATMENT  Specific treatment will depend on what area of the body was injured. In general, the best treatment for a contusion is resting, icing, elevating, and applying cold compresses to the injured area. Over-the-counter medicines may also be recommended for pain control. Ask your caregiver what the best treatment is for your contusion. HOME CARE INSTRUCTIONS   Put ice on the injured area.  Put ice in a plastic bag.  Place a towel between your skin and the bag.  Leave the ice on for 15-20 minutes, 3-4 times a day, or as directed by your health care provider.  Only take over-the-counter or prescription medicines for pain, discomfort, or fever as directed by your caregiver. Your caregiver may recommend avoiding anti-inflammatory medicines (aspirin, ibuprofen, and naproxen) for 48 hours because these medicines may increase bruising.  Rest the injured area.  If possible, elevate the injured area to reduce swelling. SEEK IMMEDIATE MEDICAL CARE IF:   You have increased bruising or swelling.  You have pain that is getting worse.  Your swelling or pain is not relieved with medicines. MAKE SURE YOU:   Understand these instructions.  Will watch your condition.  Will get help right  away if you are not doing well or get worse. Document Released: 01/16/2005 Document Revised: 04/13/2013 Document Reviewed: 02/11/2011 [facility] Patient Information 2015 [place], [place]. This information is not intended to replace advice given to you by your health care provider. Make sure you discuss any questions you have with your health care provider.  Blunt Trauma You have been evaluated for injuries. You have been examined and your caregiver has not found injuries serious enough to require hospitalization. It is common to have multiple bruises and sore muscles following an accident. These tend to feel worse for the first 24 hours. You will feel more stiffness and soreness over the next several hours and worse when you wake up the first morning after your accident. After this point, you should begin to improve with each passing day. The amount of improvement depends on the amount of damage done in the accident. Following your accident, if some part of your body does not work as it should, or if the pain in any area continues to increase, you should return to the Emergency Department for re-evaluation.  HOME CARE INSTRUCTIONS  Routine care for sore areas should include:  Ice to sore areas every 2 hours for 20 minutes while awake for the next 2 days.  Drink extra fluids (not alcohol).  Take a hot or warm shower or bath once or twice a day to increase blood flow to sore muscles. This will help you "limber up".  Activity as tolerated. Lifting may aggravate neck or back pain.  Only take over-the-counter or prescription  medicines for pain, discomfort, or fever as directed by your caregiver. Do not use aspirin. This may increase bruising or increase bleeding if there are small areas where this is happening. SEEK IMMEDIATE MEDICAL CARE IF:  Numbness, tingling, weakness, or problem with the use of your arms or legs.  A severe headache is not relieved with medications.  There is a change in bowel  or bladder control.  Increasing pain in any areas of the body.  Short of breath or dizzy.  Nauseated, vomiting, or sweating.  Increasing belly (abdominal) discomfort.  Blood in urine, stool, or vomiting blood.  Pain in either shoulder in an area where a shoulder strap would be.  Feelings of lightheadedness or if you have a fainting episode. Sometimes it is not possible to identify all injuries immediately after the trauma. It is important that you continue to monitor your condition after the emergency department visit. If you feel you are not improving, or improving more slowly than should be expected, call your physician. If you feel your symptoms (problems) are worsening, return to the Emergency Department immediately. Document Released: 01/02/2001 Document Revised: 07/01/2011 Document Reviewed: 11/25/2007 [facility] Patient Information 2015 [place], [place]. This information is not intended to replace advice given to you by your health care provider. Make sure you discuss any questions you have with your health care provider.

## 2014-10-19 ENCOUNTER — Telehealth: Payer: Self-pay | Admitting: Behavioral Health

## 2014-10-19 ENCOUNTER — Encounter: Payer: Self-pay | Admitting: Behavioral Health

## 2014-10-19 NOTE — Telephone Encounter (Signed)
Pre-Visit Call completed with patient and chart updated.   Pre-Visit Info documented in Specialty Comments under [employer].    

## 2014-10-20 ENCOUNTER — Ambulatory Visit (INDEPENDENT_AMBULATORY_CARE_PROVIDER_SITE_OTHER): Payer: BC Managed Care – PPO | Admitting: Family Medicine

## 2014-10-20 ENCOUNTER — Encounter: Payer: Self-pay | Admitting: Family Medicine

## 2014-10-20 VITALS — BP 120/80 | HR 69 | Temp 98.0°F | Resp 16 | Ht 64.0 in | Wt 194.1 lb

## 2014-10-20 DIAGNOSIS — Z0000 Encounter for general adult medical examination without abnormal findings: Secondary | ICD-10-CM

## 2014-10-20 LAB — BASIC METABOLIC PANEL
BUN: 19 mg/dL (ref 6–23)
CO2: 27 mEq/L (ref 19–32)
CREATININE: 0.81 mg/dL (ref 0.40–1.20)
Calcium: 10.4 mg/dL (ref 8.4–10.5)
Chloride: 108 mEq/L (ref 96–112)
GFR: 91.68 mL/min (ref >60.00)
GLUCOSE: 90 mg/dL (ref 70–99)
POTASSIUM: 4.1 meq/L (ref 3.5–5.1)
SODIUM: 141 meq/L (ref 135–145)

## 2014-10-20 LAB — LIPID PANEL
CHOLESTEROL: 192 mg/dL (ref 0–200)
HDL: 41.3 mg/dL (ref >39.00)
LDL Cholesterol: 133 mg/dL — ABNORMAL HIGH (ref 0–99)
NonHDL: 150.7
Total CHOL/HDL Ratio: 5
Triglycerides: 88 mg/dL (ref 0.0–149.0)
VLDL: 17.6 mg/dL (ref 0.0–40.0)

## 2014-10-20 LAB — CBC WITH DIFFERENTIAL/PLATELET
BASOS ABS: 0 10*3/uL (ref 0.0–0.1)
Basophils Relative: 0.4 % (ref 0.0–3.0)
Eosinophils Absolute: 0.2 10*3/uL (ref 0.0–0.7)
Eosinophils Relative: 2.7 % (ref 0.0–5.0)
HCT: 35.3 % — ABNORMAL LOW (ref 36.0–46.0)
Hemoglobin: 11.9 g/dL — ABNORMAL LOW (ref 12.0–15.0)
Lymphocytes Relative: 31.9 % (ref 12.0–46.0)
Lymphs Abs: 2.4 10*3/uL (ref 0.7–4.0)
MCHC: 33.6 g/dL (ref 30.0–36.0)
MCV: 88.8 fl (ref 78.0–100.0)
MONO ABS: 0.7 10*3/uL (ref 0.1–1.0)
Monocytes Relative: 9.8 % (ref 3.0–12.0)
Neutro Abs: 4.2 10*3/uL (ref 1.4–7.7)
Neutrophils Relative %: 55.2 % (ref 43.0–77.0)
Platelets: 259 10*3/uL (ref 150.0–400.0)
RBC: 3.98 Mil/uL (ref 3.87–5.11)
RDW: 13.6 % (ref 11.5–15.5)
WBC: 7.6 10*3/uL (ref 4.0–10.5)

## 2014-10-20 LAB — TSH: TSH: 1.69 u[IU]/mL (ref 0.35–4.50)

## 2014-10-20 LAB — HEPATIC FUNCTION PANEL
ALT: 17 U/L (ref 0–35)
AST: 21 U/L (ref 0–37)
Albumin: 3.8 g/dL (ref 3.5–5.2)
Alkaline Phosphatase: 76 U/L (ref 39–117)
BILIRUBIN TOTAL: 0.5 mg/dL (ref 0.2–1.2)
Bilirubin, Direct: 0.1 mg/dL (ref 0.0–0.3)
Total Protein: 7.5 g/dL (ref 6.0–8.3)

## 2014-10-20 LAB — VITAMIN D 25 HYDROXY (VIT D DEFICIENCY, FRACTURES): VITD: 31.35 ng/mL (ref 30.00–100.00)

## 2014-10-20 NOTE — Progress Notes (Signed)
   Subjective:    Patient ID: Deanna Livingston, female    DOB: 1950-06-01, 64 y.o.   MRN: [id number]  HPI CPE- UTD on colonoscopy (due 2018).  UTD on mammo.  Due for pap but doesn't have time today- coming back next week.  Exercising regularly- walking M/W/F.     Review of Systems Patient reports no vision/ hearing changes, adenopathy,fever, weight change,  persistant/recurrent hoarseness , swallowing issues, chest pain, palpitations, edema, persistant/recurrent cough, hemoptysis, dyspnea (rest/exertional/paroxysmal nocturnal), gastrointestinal bleeding (melena, rectal bleeding), abdominal pain, significant heartburn, bowel changes, GU symptoms (dysuria, hematuria, incontinence), Gyn symptoms (abnormal  bleeding, pain),  syncope, focal weakness, memory loss, numbness & tingling, skin/hair/nail changes, abnormal bruising or bleeding, anxiety, or depression.     Objective:   Physical Exam General Appearance:    Alert, cooperative, no distress, appears stated age  Head:    Normocephalic, without obvious abnormality, atraumatic  Eyes:    PERRL, conjunctiva/corneas clear, EOM's intact, fundi    benign, both eyes  Ears:    Normal TM's and external ear canals, both ears  Nose:   Nares normal, septum midline, mucosa normal, no drainage    or sinus tenderness  Throat:   Lips, mucosa, and tongue normal; teeth and gums normal  Neck:   Supple, symmetrical, trachea midline, no adenopathy;    Thyroid: no enlargement/tenderness/nodules  Back:     Symmetric, no curvature, ROM normal, no CVA tenderness  Lungs:     Clear to auscultation bilaterally, respirations unlabored  Chest Wall:    No tenderness or deformity   Heart:    Regular rate and rhythm, S1 and S2 normal, no murmur, rub   or gallop  Breast Exam:    Deferred to GYN  Abdomen:     Soft, non-tender, bowel sounds active all four quadrants,    no masses, no organomegaly  Genitalia:    Deferred to GYN  Rectal:    Extremities:   Extremities  normal, atraumatic, no cyanosis or edema  Pulses:   2+ and symmetric all extremities  Skin:   Skin color, texture, turgor normal, no rashes or lesions  Lymph nodes:   Cervical, supraclavicular, and axillary nodes normal  Neurologic:   CNII-XII intact, normal strength, sensation and reflexes    throughout          Assessment & Plan:

## 2014-10-20 NOTE — Progress Notes (Signed)
Pre visit review using our clinic review tool, if applicable. No additional management support is needed unless otherwise documented below in the visit note. 

## 2014-10-20 NOTE — Patient Instructions (Signed)
Follow up as scheduled for your pap We'll notify you of your lab results and make any changes if needed Keep up the good work!  You look great! Call with any questions or concerns Happy 4th of July!!!

## 2014-10-20 NOTE — Assessment & Plan Note (Signed)
Pt's PE WNL.  UTD on colonoscopy, mammo.  Due for pap- pt has appt scheduled for next week.  Check labs.  Anticipatory guidance provided.

## 2014-10-21 ENCOUNTER — Encounter: Payer: Self-pay | Admitting: General Practice

## 2014-10-26 ENCOUNTER — Ambulatory Visit (INDEPENDENT_AMBULATORY_CARE_PROVIDER_SITE_OTHER): Payer: BC Managed Care – PPO | Admitting: Family Medicine

## 2014-10-26 ENCOUNTER — Encounter: Payer: Self-pay | Admitting: Family Medicine

## 2014-10-26 ENCOUNTER — Other Ambulatory Visit (HOSPITAL_COMMUNITY)
Admission: RE | Admit: 2014-10-26 | Discharge: 2014-10-26 | Disposition: A | Discharge status: Home / Self Care | Payer: BC Managed Care – PPO | Source: Ambulatory Visit | Attending: Family Medicine | Admitting: Family Medicine

## 2014-10-26 VITALS — BP 120/80 | HR 73 | Temp 98.1°F | Resp 16 | Ht 64.0 in | Wt 195.2 lb

## 2014-10-26 DIAGNOSIS — Z124 Encounter for screening for malignant neoplasm of cervix: Secondary | ICD-10-CM | POA: Diagnosis not present

## 2014-10-26 DIAGNOSIS — Z01419 Encounter for gynecological examination (general) (routine) without abnormal findings: Secondary | ICD-10-CM | POA: Diagnosis present

## 2014-10-26 DIAGNOSIS — Z1151 Encounter for screening for human papillomavirus (HPV): Secondary | ICD-10-CM | POA: Insufficient documentation

## 2014-10-26 NOTE — Progress Notes (Signed)
Pre visit review using our clinic review tool, if applicable. No additional management support is needed unless otherwise documented below in the visit note. 

## 2014-10-26 NOTE — Patient Instructions (Signed)
Follow up in 1 year or as needed Keep up the good work! Call with any questions or concerns Have a great summer!!!

## 2014-10-26 NOTE — Assessment & Plan Note (Signed)
Pap collected.  Pt tolerated w/o difficulty.  No abnormalities noted

## 2014-10-26 NOTE — Progress Notes (Signed)
   Subjective:    Patient ID: Deanna Livingston, female    DOB: 1951-02-07, 64 y.o.   MRN: [id number]  HPI Pap today.  No concerns.  No abnormal bleeding, D/C, concern for STD, pelvic pain.   Review of Systems For ROS see HPI     Objective:   Physical Exam  Constitutional: She appears well-developed and well-nourished. No distress.  Genitourinary: Rectal exam shows no external hemorrhoid and anal tone normal. No breast swelling, tenderness, discharge or bleeding. There is no rash, tenderness, lesion or injury on the right labia. There is no rash, tenderness, lesion or injury on the left labia. Uterus is not deviated, not enlarged, not fixed and not tender. Cervix exhibits no motion tenderness, no discharge and no friability. Right adnexum displays no mass, no tenderness and no fullness. Left adnexum displays no mass, no tenderness and no fullness. No erythema, tenderness or bleeding in the vagina. No foreign body around the vagina. No signs of injury around the vagina. No vaginal discharge found.  Vitals reviewed.         Assessment & Plan:

## 2014-10-27 LAB — CYTOLOGY - PAP

## 2014-10-28 ENCOUNTER — Encounter: Payer: Self-pay | Admitting: General Practice

## 2014-11-17 ENCOUNTER — Encounter: Payer: BC Managed Care – PPO | Admitting: Family Medicine

## 2015-02-10 ENCOUNTER — Encounter: Payer: BC Managed Care – PPO | Admitting: Family Medicine

## 2015-02-13 ENCOUNTER — Other Ambulatory Visit: Payer: Self-pay

## 2015-02-13 DIAGNOSIS — Z1231 Encounter for screening mammogram for malignant neoplasm of breast: Secondary | ICD-10-CM

## 2015-03-15 ENCOUNTER — Ambulatory Visit
Admission: RE | Admit: 2015-03-15 | Discharge: 2015-03-15 | Disposition: A | Discharge status: Home / Self Care | Payer: BC Managed Care – PPO | Source: Ambulatory Visit

## 2015-03-15 DIAGNOSIS — Z1231 Encounter for screening mammogram for malignant neoplasm of breast: Secondary | ICD-10-CM

## 2015-09-26 ENCOUNTER — Encounter: Payer: Self-pay | Admitting: Family Medicine

## 2015-09-26 ENCOUNTER — Ambulatory Visit (INDEPENDENT_AMBULATORY_CARE_PROVIDER_SITE_OTHER): Payer: BC Managed Care – PPO | Admitting: Family Medicine

## 2015-09-26 VITALS — BP 124/82 | HR 82 | Temp 98.0°F | Resp 16 | Ht 64.0 in | Wt 190.5 lb

## 2015-09-26 DIAGNOSIS — F4321 Adjustment disorder with depressed mood: Secondary | ICD-10-CM

## 2015-09-26 NOTE — Assessment & Plan Note (Signed)
New.  Pt's husband just recently passed away.  She has not yet begun to process his death but needs forms completed for her missed time at work.  Pt plans to return to work in August as scheduled.  Discussed grief counseling and she feels at the current time, she has a good support system and does not need this.  FMLA forms completed for pt during visit.  Offered my support and will follow closely.  Total time spent w/ pt 25 min, >50% spent counseling.

## 2015-09-26 NOTE — Progress Notes (Signed)
Pre visit review using our clinic review tool, if applicable. No additional management support is needed unless otherwise documented below in the visit note. 

## 2015-09-26 NOTE — Progress Notes (Signed)
   Subjective:    Patient ID: Deanna Livingston, female    DOB: 1950/05/06, 65 y.o.   MRN: [id number]  HPI Grief- pt's husband passed away [id number] due to complications of diabetes (kidney failure, amputations, heart failure).  Service was 2 days ago.  Pt needs FMLA forms filled out b/c she has not been to work since 5/26 and will return when school resumes in August.  Pt has good family and friend support to make sure she is eating and 'not moping around the house'.  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  Pt is aware that she has a long road ahead of her but has not begun to process his death, 'i have too much to do'.     Review of Systems For ROS see HPI     Objective:   Physical Exam  Constitutional: She is oriented to person, place, and time. She appears well-developed and well-nourished. No distress.  HENT:  Head: Normocephalic and atraumatic.  Neurological: She is alert and oriented to person, place, and time.  Skin: Skin is warm and dry.  Psychiatric: She has a normal mood and affect. Her behavior is normal. Thought content normal.  Vitals reviewed.         Assessment & Plan:

## 2015-10-25 ENCOUNTER — Encounter: Payer: BC Managed Care – PPO | Admitting: Family Medicine

## 2015-11-13 ENCOUNTER — Ambulatory Visit (INDEPENDENT_AMBULATORY_CARE_PROVIDER_SITE_OTHER): Payer: BC Managed Care – PPO | Admitting: Family Medicine

## 2015-11-13 ENCOUNTER — Encounter: Payer: Self-pay | Admitting: Family Medicine

## 2015-11-13 VITALS — BP 122/80 | HR 81 | Temp 98.9°F | Resp 16 | Ht 64.0 in | Wt 199.0 lb

## 2015-11-13 DIAGNOSIS — Z0000 Encounter for general adult medical examination without abnormal findings: Secondary | ICD-10-CM

## 2015-11-13 DIAGNOSIS — M810 Age-related osteoporosis without current pathological fracture: Secondary | ICD-10-CM | POA: Insufficient documentation

## 2015-11-13 DIAGNOSIS — M8580 Other specified disorders of bone density and structure, unspecified site: Secondary | ICD-10-CM | POA: Diagnosis not present

## 2015-11-13 DIAGNOSIS — M799 Soft tissue disorder, unspecified: Secondary | ICD-10-CM

## 2015-11-13 DIAGNOSIS — M7989 Other specified soft tissue disorders: Secondary | ICD-10-CM

## 2015-11-13 NOTE — Progress Notes (Signed)
Pre visit review using our clinic review tool, if applicable. No additional management support is needed unless otherwise documented below in the visit note. 

## 2015-11-13 NOTE — Progress Notes (Signed)
   Subjective:    Patient ID: Deanna Livingston, female    DOB: 11/15/1950, 65 y.o.   MRN: [id number]  HPI CPE- UTD on pap, mammo, colonoscopy.  Due for DEXA (breast center).  Due for Zoster but pt declines at this time.  Pt is ready to return to work after the death of her husband.  Exercises regularly, sings in the choir.   Review of Systems Patient reports no vision/ hearing changes, adenopathy,fever, weight change,  persistant/recurrent hoarseness , swallowing issues, chest pain, palpitations, edema, persistant/recurrent cough, hemoptysis, dyspnea (rest/exertional/paroxysmal nocturnal), gastrointestinal bleeding (melena, rectal bleeding), abdominal pain, significant heartburn, bowel changes, GU symptoms (dysuria, hematuria, incontinence), Gyn symptoms (abnormal  bleeding, pain),  syncope, focal weakness, memory loss, numbness & tingling, skin/hair/nail changes, abnormal bruising or bleeding, anxiety, or depression.   Soft tissue mass R proximal forearm- no TTP.    Objective:   Physical Exam General Appearance:    Alert, cooperative, no distress, appears stated age  Head:    Normocephalic, without obvious abnormality, atraumatic  Eyes:    PERRL, conjunctiva/corneas clear, EOM's intact, fundi    benign, both eyes  Ears:    Normal TM's and external ear canals, both ears  Nose:   Nares normal, septum midline, mucosa normal, no drainage    or sinus tenderness  Throat:   Lips, mucosa, and tongue normal; teeth and gums normal  Neck:   Supple, symmetrical, trachea midline, no adenopathy;    Thyroid: no enlargement/tenderness/nodules  Back:     Symmetric, no curvature, ROM normal, no CVA tenderness  Lungs:     Clear to auscultation bilaterally, respirations unlabored  Chest Wall:    No tenderness or deformity   Heart:    Regular rate and rhythm, S1 and S2 normal, no murmur, rub   or gallop  Breast Exam:    Deferred to mammo  Abdomen:     Soft, non-tender, bowel sounds active all four  quadrants,    no masses, no organomegaly  Genitalia:    Deferred  Rectal:    Extremities:   Extremities normal, atraumatic, no cyanosis or edema.  Soft tissue mass over R proximal forearm  Pulses:   2+ and symmetric all extremities  Skin:   Skin color, texture, turgor normal, no rashes or lesions  Lymph nodes:   Cervical, supraclavicular, and axillary nodes normal  Neurologic:   CNII-XII intact, normal strength, sensation and reflexes    throughout          Assessment & Plan:

## 2015-11-13 NOTE — Assessment & Plan Note (Signed)
Noted on previous DEXA.  Pt to have repeat- order entered.  Advised on continued Calcium and Vit D.

## 2015-11-13 NOTE — Patient Instructions (Signed)
Schedule a fasting lab visit for Wednesday of this week at [facility] Follow up in 1 year or as needed We'll call you with the [place] appt for the R arm Continue to work on healthy diet and regular exercise- you can do it! When you have your mammo in November, you should also get a bone density (the order is in) Call with any questions or concerns Enjoy the rest of your summer!!!

## 2015-11-13 NOTE — Assessment & Plan Note (Signed)
New.  Get [place] to assess.  If any ambiguity, will refer to ortho for evaluation and tx.  Pt expressed understanding and is in agreement w/ plan.

## 2015-11-13 NOTE — Assessment & Plan Note (Signed)
Pt's PE WNL w/ exception of soft tissue mass on R forearm and being overweight.  UTD on mammo, colonoscopy, pap.  Declines Zoster today.  Check labs.  Anticipatory guidance provided.

## 2015-11-15 ENCOUNTER — Encounter: Payer: Self-pay | Admitting: General Practice

## 2015-11-15 ENCOUNTER — Other Ambulatory Visit (INDEPENDENT_AMBULATORY_CARE_PROVIDER_SITE_OTHER): Payer: BC Managed Care – PPO

## 2015-11-15 ENCOUNTER — Ambulatory Visit (HOSPITAL_BASED_OUTPATIENT_CLINIC_OR_DEPARTMENT_OTHER)
Admission: RE | Admit: 2015-11-15 | Discharge: 2015-11-15 | Disposition: A | Discharge status: Home / Self Care | Payer: BC Managed Care – PPO | Source: Ambulatory Visit | Attending: Family Medicine | Admitting: Family Medicine

## 2015-11-15 ENCOUNTER — Telehealth: Payer: Self-pay | Admitting: General Practice

## 2015-11-15 DIAGNOSIS — Z0000 Encounter for general adult medical examination without abnormal findings: Secondary | ICD-10-CM

## 2015-11-15 DIAGNOSIS — M7989 Other specified soft tissue disorders: Secondary | ICD-10-CM

## 2015-11-15 DIAGNOSIS — M8580 Other specified disorders of bone density and structure, unspecified site: Secondary | ICD-10-CM

## 2015-11-15 DIAGNOSIS — M8588 Other specified disorders of bone density and structure, other site: Secondary | ICD-10-CM | POA: Diagnosis not present

## 2015-11-15 DIAGNOSIS — M799 Soft tissue disorder, unspecified: Secondary | ICD-10-CM | POA: Insufficient documentation

## 2015-11-15 DIAGNOSIS — E2839 Other primary ovarian failure: Secondary | ICD-10-CM | POA: Insufficient documentation

## 2015-11-15 LAB — BASIC METABOLIC PANEL
BUN: 15 mg/dL (ref 6–23)
CALCIUM: 10 mg/dL (ref 8.4–10.5)
CO2: 27 meq/L (ref 19–32)
Chloride: 108 mEq/L (ref 96–112)
Creatinine, Ser: 0.83 mg/dL (ref 0.40–1.20)
GFR: 88.83 mL/min (ref >60.00)
Glucose, Bld: 96 mg/dL (ref 70–99)
POTASSIUM: 4 meq/L (ref 3.5–5.1)
SODIUM: 139 meq/L (ref 135–145)

## 2015-11-15 LAB — HEPATIC FUNCTION PANEL
ALK PHOS: 63 U/L (ref 39–117)
ALT: 15 U/L (ref 0–35)
AST: 17 U/L (ref 0–37)
Albumin: 3.8 g/dL (ref 3.5–5.2)
BILIRUBIN DIRECT: 0.1 mg/dL (ref 0.0–0.3)
TOTAL PROTEIN: 7.2 g/dL (ref 6.0–8.3)
Total Bilirubin: 0.5 mg/dL (ref 0.2–1.2)

## 2015-11-15 LAB — CBC WITH DIFFERENTIAL/PLATELET
BASOS ABS: 0 10*3/uL (ref 0.0–0.1)
Basophils Relative: 0.5 % (ref 0.0–3.0)
EOS ABS: 0.2 10*3/uL (ref 0.0–0.7)
Eosinophils Relative: 2 % (ref 0.0–5.0)
HCT: 35.6 % — ABNORMAL LOW (ref 36.0–46.0)
Hemoglobin: 12 g/dL (ref 12.0–15.0)
LYMPHS ABS: 2.5 10*3/uL (ref 0.7–4.0)
Lymphocytes Relative: 32.4 % (ref 12.0–46.0)
MCHC: 33.7 g/dL (ref 30.0–36.0)
MCV: 88 fl (ref 78.0–100.0)
Monocytes Absolute: 0.7 10*3/uL (ref 0.1–1.0)
Monocytes Relative: 8.7 % (ref 3.0–12.0)
NEUTROS ABS: 4.3 10*3/uL (ref 1.4–7.7)
NEUTROS PCT: 56.4 % (ref 43.0–77.0)
PLATELETS: 264 10*3/uL (ref 150.0–400.0)
RBC: 4.05 Mil/uL (ref 3.87–5.11)
RDW: 13.5 % (ref 11.5–15.5)
WBC: 7.7 10*3/uL (ref 4.0–10.5)

## 2015-11-15 LAB — LIPID PANEL
CHOLESTEROL: 176 mg/dL (ref 0–200)
HDL: 44.8 mg/dL (ref >39.00)
LDL Cholesterol: 117 mg/dL — ABNORMAL HIGH (ref 0–99)
NonHDL: 131.36
Total CHOL/HDL Ratio: 4
Triglycerides: 70 mg/dL (ref 0.0–149.0)
VLDL: 14 mg/dL (ref 0.0–40.0)

## 2015-11-15 LAB — TSH: TSH: 3.31 u[IU]/mL (ref 0.35–4.50)

## 2015-11-15 LAB — VITAMIN D 25 HYDROXY (VIT D DEFICIENCY, FRACTURES): VITD: 41.7 ng/mL (ref 30.00–100.00)

## 2015-11-15 NOTE — Telephone Encounter (Signed)
Order placed as pt requested.

## 2015-11-15 NOTE — Telephone Encounter (Signed)
Ok for MRI R forearm w/o and w/ contrast to evaluate soft tissue mass

## 2015-11-15 NOTE — Telephone Encounter (Signed)
-----   Message from Rosebud Poles sent at 11/15/2015  4:23 PM EDT ----- Can you place an order for MRI/US for this Pt so I can get it schedule. Thanks

## 2015-11-15 NOTE — Telephone Encounter (Signed)
Please advise per staff message"Can you place an order for MRI/US for this Pt so I can get it schedule. Thanks "

## 2015-11-15 NOTE — Progress Notes (Signed)
Called pt and lmovm to return call.

## 2015-11-23 ENCOUNTER — Other Ambulatory Visit: Payer: Self-pay | Admitting: Family Medicine

## 2015-11-23 DIAGNOSIS — M7989 Other specified soft tissue disorders: Secondary | ICD-10-CM

## 2016-02-07 ENCOUNTER — Other Ambulatory Visit: Payer: Self-pay | Admitting: Orthopedic Surgery

## 2016-02-12 ENCOUNTER — Other Ambulatory Visit: Payer: Self-pay | Admitting: Family Medicine

## 2016-02-12 DIAGNOSIS — Z1231 Encounter for screening mammogram for malignant neoplasm of breast: Secondary | ICD-10-CM

## 2016-03-18 ENCOUNTER — Ambulatory Visit
Admission: RE | Admit: 2016-03-18 | Discharge: 2016-03-18 | Disposition: A | Discharge status: Home / Self Care | Payer: BC Managed Care – PPO | Source: Ambulatory Visit | Attending: Family Medicine | Admitting: Family Medicine

## 2016-03-18 DIAGNOSIS — Z1231 Encounter for screening mammogram for malignant neoplasm of breast: Secondary | ICD-10-CM

## 2016-07-18 DIAGNOSIS — H2513 Age-related nuclear cataract, bilateral: Secondary | ICD-10-CM | POA: Diagnosis not present

## 2016-07-18 DIAGNOSIS — H524 Presbyopia: Secondary | ICD-10-CM | POA: Diagnosis not present

## 2017-03-06 ENCOUNTER — Encounter: Payer: Self-pay | Admitting: Internal Medicine

## 2017-04-11 ENCOUNTER — Encounter: Payer: Self-pay | Admitting: Internal Medicine

## 2017-04-28 ENCOUNTER — Encounter: Payer: Self-pay | Admitting: Family Medicine

## 2017-04-28 ENCOUNTER — Ambulatory Visit (INDEPENDENT_AMBULATORY_CARE_PROVIDER_SITE_OTHER): Payer: Medicare Other | Admitting: Family Medicine

## 2017-04-28 ENCOUNTER — Other Ambulatory Visit: Payer: Self-pay

## 2017-04-28 VITALS — BP 128/84 | HR 82 | Temp 98.1°F | Resp 16 | Ht 63.5 in | Wt 199.4 lb

## 2017-04-28 DIAGNOSIS — K219 Gastro-esophageal reflux disease without esophagitis: Secondary | ICD-10-CM | POA: Diagnosis not present

## 2017-04-28 DIAGNOSIS — E6609 Other obesity due to excess calories: Secondary | ICD-10-CM

## 2017-04-28 DIAGNOSIS — Z23 Encounter for immunization: Secondary | ICD-10-CM

## 2017-04-28 DIAGNOSIS — M25552 Pain in left hip: Secondary | ICD-10-CM | POA: Diagnosis not present

## 2017-04-28 DIAGNOSIS — Z6834 Body mass index (BMI) 34.0-34.9, adult: Secondary | ICD-10-CM | POA: Diagnosis not present

## 2017-04-28 NOTE — Assessment & Plan Note (Signed)
Recurrent issue for pt, new to provider.  Not interested in scheduled NSAID.  No evidence of trochanteric bursitis.  Alternate heat/ice for pain relief.  Continue Turmeric.  Reviewed supportive care and red flags that should prompt return.  Pt expressed understanding and is in agreement w/ plan.

## 2017-04-28 NOTE — Assessment & Plan Note (Signed)
Ongoing issue for pt.  She is having some sxs but overall, well controlled with diet and lifestyle changes.  Pt is not interested in daily controller medication.  Continue Tums or Alka Seltzer prn.  Pt expressed understanding and is in agreement w/ plan.

## 2017-04-28 NOTE — Progress Notes (Signed)
   Subjective:    Patient ID: Deanna Livingston, female    DOB: 1950/07/20, 67 y.o.   MRN: [id number]  HPI Obesity- pt's BMI is 34.76 but weight is stable x18 months.  Exercising- does squats, lifts weights, walking.  Not following a particular diet.  No CP, SOB, HAs, visual changes, edema.  GERD- pt reports it's bothering her 'just a little bit'.  Not a daily occurrence.  Not interested in medication at this time.  Denies abd pain, N/V.  L hip pain- pt will have pain when lying on L side 'for too long'.  Pain improves w/ exercise.  No TTP.  Pt prefers to take Turmeric rather than prescription NSAIDs.  Health Maintenance- needs to schedule, colonoscopy is next month, UTD on DEXA, UTD on Tdap, declines flu, due for Prevnar today  Review of Systems For ROS see HPI     Objective:   Physical Exam  Constitutional: She is oriented to person, place, and time. She appears well-developed and well-nourished. No distress.  obese  HENT:  Head: Normocephalic and atraumatic.  Eyes: Conjunctivae and EOM are normal. Pupils are equal, round, and reactive to light.  Neck: Normal range of motion. Neck supple. No thyromegaly present.  Cardiovascular: Normal rate, regular rhythm, normal heart sounds and intact distal pulses.  No murmur heard. Pulmonary/Chest: Effort normal and breath sounds normal. No respiratory distress.  Abdominal: Soft. She exhibits no distension. There is no tenderness.  Musculoskeletal: She exhibits no edema, tenderness (no TTP over L greater trochanter) or deformity.  Lymphadenopathy:    She has no cervical adenopathy.  Neurological: She is alert and oriented to person, place, and time.  Skin: Skin is warm and dry.  Psychiatric: She has a normal mood and affect. Her behavior is normal.  Vitals reviewed.         Assessment & Plan:

## 2017-04-28 NOTE — Patient Instructions (Signed)
Schedule your Medicare Wellness Visit with Deanna Livingston in 6 months We'll notify you of your lab results and make any changes if needed Continue to work on healthy diet and regular exercise- you can do it! Continue to use Tums or Alka Seltzer to improve the heartburn/reflux Alternate ice/heat for the L hip pain and ibuprofen as needed CALL AND SCHEDULE YOUR MAMMOGRAM!! Call with any questions or concerns Happy New Year!!!

## 2017-04-28 NOTE — Addendum Note (Signed)
Addended by: Davis Gourd on: 04/28/2017 04:27 PM   Modules accepted: Orders

## 2017-04-28 NOTE — Assessment & Plan Note (Signed)
Ongoing issue for pt.  Is exercising regularly.  Stressed need for healthy diet.  Check labs to risk stratify.  Will follow.

## 2017-04-29 ENCOUNTER — Other Ambulatory Visit: Payer: Self-pay | Admitting: Family Medicine

## 2017-04-29 ENCOUNTER — Encounter: Payer: Self-pay | Admitting: General Practice

## 2017-04-29 DIAGNOSIS — Z1231 Encounter for screening mammogram for malignant neoplasm of breast: Secondary | ICD-10-CM

## 2017-04-29 LAB — CBC WITH DIFFERENTIAL/PLATELET
Basophils Absolute: 50 cells/uL (ref 0–200)
Basophils Relative: 0.6 %
EOS ABS: 109 {cells}/uL (ref 15–500)
Eosinophils Relative: 1.3 %
HCT: 34.4 % — ABNORMAL LOW (ref 35.0–45.0)
Hemoglobin: 11.8 g/dL (ref 11.7–15.5)
Lymphs Abs: 2495 cells/uL (ref 850–3900)
MCH: 29.7 pg (ref 27.0–33.0)
MCHC: 34.3 g/dL (ref 32.0–36.0)
MCV: 86.6 fL (ref 80.0–100.0)
MONOS PCT: 9 %
MPV: 12.5 fL (ref 7.5–12.5)
Neutro Abs: 4990 cells/uL (ref 1500–7800)
Neutrophils Relative %: 59.4 %
Platelets: 280 10*3/uL (ref 140–400)
RBC: 3.97 10*6/uL (ref 3.80–5.10)
RDW: 12.4 % (ref 11.0–15.0)
Total Lymphocyte: 29.7 %
WBC mixed population: 756 cells/uL (ref 200–950)
WBC: 8.4 10*3/uL (ref 3.8–10.8)

## 2017-04-29 LAB — HEPATIC FUNCTION PANEL
AG Ratio: 1.4 (calc) (ref 1.0–2.5)
ALKALINE PHOSPHATASE (APISO): 62 U/L (ref 33–130)
ALT: 17 U/L (ref 6–29)
AST: 19 U/L (ref 10–35)
Albumin: 3.9 g/dL (ref 3.6–5.1)
BILIRUBIN INDIRECT: 0.4 mg/dL (ref 0.2–1.2)
Bilirubin, Direct: 0.1 mg/dL (ref 0.0–0.2)
Globulin: 2.8 g/dL (calc) (ref 1.9–3.7)
TOTAL PROTEIN: 6.7 g/dL (ref 6.1–8.1)
Total Bilirubin: 0.5 mg/dL (ref 0.2–1.2)

## 2017-04-29 LAB — LIPID PANEL
CHOL/HDL RATIO: 4.4 (calc) (ref <5.0)
CHOLESTEROL: 192 mg/dL (ref <200)
HDL: 44 mg/dL — AB (ref >50)
LDL CHOLESTEROL (CALC): 125 mg/dL — AB
Non-HDL Cholesterol (Calc): 148 mg/dL (calc) — ABNORMAL HIGH (ref <130)
Triglycerides: 118 mg/dL (ref <150)

## 2017-04-29 LAB — BASIC METABOLIC PANEL
BUN/Creatinine Ratio: 18 (calc) (ref 6–22)
BUN: 21 mg/dL (ref 7–25)
CO2: 28 mmol/L (ref 20–32)
Calcium: 10.5 mg/dL — ABNORMAL HIGH (ref 8.6–10.4)
Chloride: 108 mmol/L (ref 98–110)
Creat: 1.17 mg/dL — ABNORMAL HIGH (ref 0.50–0.99)
Glucose, Bld: 80 mg/dL (ref 65–99)
POTASSIUM: 4 mmol/L (ref 3.5–5.3)
Sodium: 141 mmol/L (ref 135–146)

## 2017-05-19 DIAGNOSIS — M542 Cervicalgia: Secondary | ICD-10-CM | POA: Diagnosis not present

## 2017-05-19 DIAGNOSIS — M2560 Stiffness of unspecified joint, not elsewhere classified: Secondary | ICD-10-CM | POA: Diagnosis not present

## 2017-05-19 DIAGNOSIS — M545 Low back pain: Secondary | ICD-10-CM | POA: Diagnosis not present

## 2017-05-19 DIAGNOSIS — M9901 Segmental and somatic dysfunction of cervical region: Secondary | ICD-10-CM | POA: Diagnosis not present

## 2017-05-19 DIAGNOSIS — M9903 Segmental and somatic dysfunction of lumbar region: Secondary | ICD-10-CM | POA: Diagnosis not present

## 2017-05-19 DIAGNOSIS — R293 Abnormal posture: Secondary | ICD-10-CM | POA: Diagnosis not present

## 2017-05-26 DIAGNOSIS — M9901 Segmental and somatic dysfunction of cervical region: Secondary | ICD-10-CM | POA: Diagnosis not present

## 2017-05-26 DIAGNOSIS — M9903 Segmental and somatic dysfunction of lumbar region: Secondary | ICD-10-CM | POA: Diagnosis not present

## 2017-05-26 DIAGNOSIS — M542 Cervicalgia: Secondary | ICD-10-CM | POA: Diagnosis not present

## 2017-05-26 DIAGNOSIS — M2560 Stiffness of unspecified joint, not elsewhere classified: Secondary | ICD-10-CM | POA: Diagnosis not present

## 2017-05-26 DIAGNOSIS — R293 Abnormal posture: Secondary | ICD-10-CM | POA: Diagnosis not present

## 2017-05-26 DIAGNOSIS — M545 Low back pain: Secondary | ICD-10-CM | POA: Diagnosis not present

## 2017-05-28 DIAGNOSIS — M9903 Segmental and somatic dysfunction of lumbar region: Secondary | ICD-10-CM | POA: Diagnosis not present

## 2017-05-28 DIAGNOSIS — M542 Cervicalgia: Secondary | ICD-10-CM | POA: Diagnosis not present

## 2017-05-28 DIAGNOSIS — M9901 Segmental and somatic dysfunction of cervical region: Secondary | ICD-10-CM | POA: Diagnosis not present

## 2017-05-28 DIAGNOSIS — M545 Low back pain: Secondary | ICD-10-CM | POA: Diagnosis not present

## 2017-05-30 DIAGNOSIS — M9901 Segmental and somatic dysfunction of cervical region: Secondary | ICD-10-CM | POA: Diagnosis not present

## 2017-05-30 DIAGNOSIS — M9903 Segmental and somatic dysfunction of lumbar region: Secondary | ICD-10-CM | POA: Diagnosis not present

## 2017-05-30 DIAGNOSIS — M542 Cervicalgia: Secondary | ICD-10-CM | POA: Diagnosis not present

## 2017-05-30 DIAGNOSIS — M545 Low back pain: Secondary | ICD-10-CM | POA: Diagnosis not present

## 2017-06-02 DIAGNOSIS — M542 Cervicalgia: Secondary | ICD-10-CM | POA: Diagnosis not present

## 2017-06-02 DIAGNOSIS — M9903 Segmental and somatic dysfunction of lumbar region: Secondary | ICD-10-CM | POA: Diagnosis not present

## 2017-06-02 DIAGNOSIS — M9901 Segmental and somatic dysfunction of cervical region: Secondary | ICD-10-CM | POA: Diagnosis not present

## 2017-06-02 DIAGNOSIS — M545 Low back pain: Secondary | ICD-10-CM | POA: Diagnosis not present

## 2017-06-04 ENCOUNTER — Other Ambulatory Visit: Payer: Self-pay

## 2017-06-04 ENCOUNTER — Ambulatory Visit
Admission: RE | Admit: 2017-06-04 | Discharge: 2017-06-04 | Disposition: A | Discharge status: Home / Self Care | Payer: BC Managed Care – PPO | Source: Ambulatory Visit | Attending: Family Medicine | Admitting: Family Medicine

## 2017-06-04 ENCOUNTER — Ambulatory Visit (AMBULATORY_SURGERY_CENTER): Payer: Self-pay | Admitting: *Deleted

## 2017-06-04 VITALS — Ht 63.5 in | Wt 198.0 lb

## 2017-06-04 DIAGNOSIS — M545 Low back pain: Secondary | ICD-10-CM | POA: Diagnosis not present

## 2017-06-04 DIAGNOSIS — Z1231 Encounter for screening mammogram for malignant neoplasm of breast: Secondary | ICD-10-CM

## 2017-06-04 DIAGNOSIS — M542 Cervicalgia: Secondary | ICD-10-CM | POA: Diagnosis not present

## 2017-06-04 DIAGNOSIS — M9903 Segmental and somatic dysfunction of lumbar region: Secondary | ICD-10-CM | POA: Diagnosis not present

## 2017-06-04 DIAGNOSIS — Z86010 Personal history of colonic polyps: Secondary | ICD-10-CM

## 2017-06-04 DIAGNOSIS — M9901 Segmental and somatic dysfunction of cervical region: Secondary | ICD-10-CM | POA: Diagnosis not present

## 2017-06-04 NOTE — Progress Notes (Signed)
Patient denies any allergies to eggs or soy. Patient denies any problems with anesthesia/sedation. Patient denies any oxygen use at home. Patient denies taking any diet/weight loss medications or blood thinners. EMMI education assisgned to patient on colonoscopy, this was explained and instructions given to patient. 

## 2017-06-06 DIAGNOSIS — M545 Low back pain: Secondary | ICD-10-CM | POA: Diagnosis not present

## 2017-06-06 DIAGNOSIS — M542 Cervicalgia: Secondary | ICD-10-CM | POA: Diagnosis not present

## 2017-06-06 DIAGNOSIS — M9903 Segmental and somatic dysfunction of lumbar region: Secondary | ICD-10-CM | POA: Diagnosis not present

## 2017-06-06 DIAGNOSIS — M9901 Segmental and somatic dysfunction of cervical region: Secondary | ICD-10-CM | POA: Diagnosis not present

## 2017-06-09 DIAGNOSIS — M542 Cervicalgia: Secondary | ICD-10-CM | POA: Diagnosis not present

## 2017-06-09 DIAGNOSIS — M9901 Segmental and somatic dysfunction of cervical region: Secondary | ICD-10-CM | POA: Diagnosis not present

## 2017-06-09 DIAGNOSIS — M9903 Segmental and somatic dysfunction of lumbar region: Secondary | ICD-10-CM | POA: Diagnosis not present

## 2017-06-09 DIAGNOSIS — M545 Low back pain: Secondary | ICD-10-CM | POA: Diagnosis not present

## 2017-06-11 DIAGNOSIS — M9903 Segmental and somatic dysfunction of lumbar region: Secondary | ICD-10-CM | POA: Diagnosis not present

## 2017-06-11 DIAGNOSIS — M542 Cervicalgia: Secondary | ICD-10-CM | POA: Diagnosis not present

## 2017-06-11 DIAGNOSIS — M545 Low back pain: Secondary | ICD-10-CM | POA: Diagnosis not present

## 2017-06-11 DIAGNOSIS — M9901 Segmental and somatic dysfunction of cervical region: Secondary | ICD-10-CM | POA: Diagnosis not present

## 2017-06-13 DIAGNOSIS — M545 Low back pain: Secondary | ICD-10-CM | POA: Diagnosis not present

## 2017-06-13 DIAGNOSIS — M9901 Segmental and somatic dysfunction of cervical region: Secondary | ICD-10-CM | POA: Diagnosis not present

## 2017-06-13 DIAGNOSIS — M9903 Segmental and somatic dysfunction of lumbar region: Secondary | ICD-10-CM | POA: Diagnosis not present

## 2017-06-13 DIAGNOSIS — M542 Cervicalgia: Secondary | ICD-10-CM | POA: Diagnosis not present

## 2017-06-18 DIAGNOSIS — M9901 Segmental and somatic dysfunction of cervical region: Secondary | ICD-10-CM | POA: Diagnosis not present

## 2017-06-18 DIAGNOSIS — M542 Cervicalgia: Secondary | ICD-10-CM | POA: Diagnosis not present

## 2017-06-18 DIAGNOSIS — M545 Low back pain: Secondary | ICD-10-CM | POA: Diagnosis not present

## 2017-06-18 DIAGNOSIS — M9903 Segmental and somatic dysfunction of lumbar region: Secondary | ICD-10-CM | POA: Diagnosis not present

## 2017-06-19 ENCOUNTER — Encounter: Payer: Self-pay | Admitting: Internal Medicine

## 2017-06-19 ENCOUNTER — Ambulatory Visit (AMBULATORY_SURGERY_CENTER): Payer: Medicare Other | Admitting: Internal Medicine

## 2017-06-19 ENCOUNTER — Other Ambulatory Visit: Payer: Self-pay

## 2017-06-19 VITALS — BP 131/78 | HR 61 | Temp 98.4°F | Resp 12 | Ht 63.5 in | Wt 199.0 lb

## 2017-06-19 DIAGNOSIS — D123 Benign neoplasm of transverse colon: Secondary | ICD-10-CM

## 2017-06-19 DIAGNOSIS — Z1211 Encounter for screening for malignant neoplasm of colon: Secondary | ICD-10-CM | POA: Diagnosis not present

## 2017-06-19 DIAGNOSIS — Z86010 Personal history of colonic polyps: Secondary | ICD-10-CM

## 2017-06-19 DIAGNOSIS — D125 Benign neoplasm of sigmoid colon: Secondary | ICD-10-CM | POA: Diagnosis not present

## 2017-06-19 MED ORDER — SODIUM CHLORIDE 0.9 % IV SOLN: 500.0000 mL | Freq: Once | INTRAVENOUS | Status: DC

## 2017-06-19 NOTE — Progress Notes (Signed)
Called to room to assist during endoscopic procedure.  Patient ID and intended procedure confirmed with present staff. Received instructions for my participation in the procedure from the performing physician.  

## 2017-06-19 NOTE — Progress Notes (Signed)
To PACU, VSS. Report to [email or web address] 

## 2017-06-19 NOTE — Op Note (Signed)
[facility] Patient Name: Deanna Livingston Procedure Date: 06/19/2017 9:18 AM MRN: [id number] Endoscopist: Gatha Mayer , MD Age: 67 Referring MD:  Date of Birth: Feb 22, 1951 Gender: Female Account #: [id number] Procedure:                Colonoscopy Indications:              Surveillance: Personal history of adenomatous                            polyps on last colonoscopy > 5 years ago Medicines:                Propofol per Anesthesia, Monitored Anesthesia Care Procedure:                Pre-Anesthesia Assessment:                           - Prior to the procedure, a History and Physical                            was performed, and patient medications and                            allergies were reviewed. The patient's tolerance of                            previous anesthesia was also reviewed. The risks                            and benefits of the procedure and the sedation                            options and risks were discussed with the patient.                            All questions were answered, and informed consent                            was obtained. Prior Anticoagulants: The patient has                            taken no previous anticoagulant or antiplatelet                            agents. ASA Grade Assessment: II - A patient with                            mild systemic disease. After reviewing the risks                            and benefits, the patient was deemed in                            satisfactory condition to undergo the procedure.  After obtaining informed consent, the colonoscope                            was passed under direct vision. Throughout the                            procedure, the patient's blood pressure, pulse, and                            oxygen saturations were monitored continuously. The                            Colonoscope was introduced through the anus and     advanced to the the cecum, identified by                            appendiceal orifice and ileocecal valve. The                            colonoscopy was performed without difficulty. The                            patient tolerated the procedure well. The quality                            of the bowel preparation was excellent. The bowel                            preparation used was Miralax. The ileocecal valve,                            appendiceal orifice, and rectum were photographed. Scope In: 9:29:11 AM Scope Out: 9:43:40 AM Scope Withdrawal Time: 0 hours 10 minutes 13 seconds  Total Procedure Duration: 0 hours 14 minutes 29 seconds  Findings:                 The perianal and digital rectal examinations were                            normal.                           Two sessile polyps were found in the sigmoid colon                            and transverse colon. The polyps were diminutive in                            size. These polyps were removed with a cold snare.                            Resection and retrieval were complete. Verification  of patient identification for the specimen was                            done. Estimated blood loss was minimal.                           The exam was otherwise without abnormality on                            direct and retroflexion views. Complications:            No immediate complications. Estimated Blood Loss:     Estimated blood loss was minimal. Impression:               - Two diminutive polyps in the sigmoid colon and in                            the transverse colon, removed with a cold snare.                            Resected and retrieved.                           - The examination was otherwise normal on direct                            and retroflexion views. Recommendation:           - Patient has a contact number available for                            emergencies. The signs and  symptoms of potential                            delayed complications were discussed with the                            patient. Return to normal activities tomorrow.                            Written discharge instructions were provided to the                            patient.                           - Resume previous diet.                           - Continue present medications.                           - Repeat colonoscopy is recommended for                            surveillance. The colonoscopy date will be  determined after pathology results from today's                            exam become available for review. Gatha Mayer, MD 06/19/2017 9:50:30 AM This report has been signed electronically.

## 2017-06-19 NOTE — Patient Instructions (Addendum)
   I found and removed 2 tiny polyps. Otherwise ok.  I will let you know pathology results and when to have another routine colonoscopy by mail and/or My Chart.  I appreciate the opportunity to care for you. Gatha Mayer, MD, [facility]  Handout given for polyps  YOU HAD AN ENDOSCOPIC PROCEDURE TODAY AT [place]:   Refer to the procedure report that was given to you for any specific questions about what was found during the examination.  If the procedure report does not answer your questions, please call your gastroenterologist to clarify.  If you requested that your care partner not be given the details of your procedure findings, then the procedure report has been included in a sealed envelope for you to review at your convenience later.  YOU SHOULD EXPECT: Some feelings of bloating in the abdomen. Passage of more gas than usual.  Walking can help get rid of the air that was put into your GI tract during the procedure and reduce the bloating. If you had a lower endoscopy (such as a colonoscopy or flexible sigmoidoscopy) you may notice spotting of blood in your stool or on the toilet paper. If you underwent a bowel prep for your procedure, you may not have a normal bowel movement for a few days.  Please Note:  You might notice some irritation and congestion in your nose or some drainage.  This is from the oxygen used during your procedure.  There is no need for concern and it should clear up in a day or so.  SYMPTOMS TO REPORT IMMEDIATELY:   Following lower endoscopy (colonoscopy or flexible sigmoidoscopy):  Excessive amounts of blood in the stool  Significant tenderness or worsening of abdominal pains  Swelling of the abdomen that is new, acute  Fever of 100F or higher  For urgent or emergent issues, a gastroenterologist can be reached at any hour by calling [phone].   DIET:  We do recommend a small meal at first, but then you may proceed to your regular  diet.  Drink plenty of fluids but you should avoid alcoholic beverages for 24 hours.  ACTIVITY:  You should plan to take it easy for the rest of today and you should NOT DRIVE or use heavy machinery until tomorrow (because of the sedation medicines used during the test).    FOLLOW UP: Our staff will call the number listed on your records the next business day following your procedure to check on you and address any questions or concerns that you may have regarding the information given to you following your procedure. If we do not reach you, we will leave a message.  However, if you are feeling well and you are not experiencing any problems, there is no need to return our call.  We will assume that you have returned to your regular daily activities without incident.  If any biopsies were taken you will be contacted by phone or by letter within the next 1-3 weeks.  Please call us at [phone] if you have not heard about the biopsies in 3 weeks.    SIGNATURES/CONFIDENTIALITY: You and/or your care partner have signed paperwork which will be entered into your electronic medical record.  These signatures attest to the fact that that the information above on your After Visit Summary has been reviewed and is understood.  Full responsibility of the confidentiality of this discharge information lies with you and/or your care-partner.

## 2017-06-20 ENCOUNTER — Telehealth: Payer: Self-pay | Admitting: *Deleted

## 2017-06-20 DIAGNOSIS — M9903 Segmental and somatic dysfunction of lumbar region: Secondary | ICD-10-CM | POA: Diagnosis not present

## 2017-06-20 DIAGNOSIS — M545 Low back pain: Secondary | ICD-10-CM | POA: Diagnosis not present

## 2017-06-20 DIAGNOSIS — M542 Cervicalgia: Secondary | ICD-10-CM | POA: Diagnosis not present

## 2017-06-20 DIAGNOSIS — M9901 Segmental and somatic dysfunction of cervical region: Secondary | ICD-10-CM | POA: Diagnosis not present

## 2017-06-20 NOTE — Telephone Encounter (Signed)
  Follow up Call-  Call back number 06/19/2017  Post procedure Call Back phone  # [phone]  Permission to leave phone message Yes  Some recent data might be hidden     Patient questions:  Do you have a fever, pain , or abdominal swelling? No. Pain Score  0 *  Have you tolerated food without any problems? Yes.    Have you been able to return to your normal activities? Yes.    Do you have any questions about your discharge instructions: Diet   No. Medications  No. Follow up visit  No.  Do you have questions or concerns about your Care? No.  Actions: * If pain score is 4 or above: No action needed, pain <4.

## 2017-06-23 DIAGNOSIS — M545 Low back pain: Secondary | ICD-10-CM | POA: Diagnosis not present

## 2017-06-23 DIAGNOSIS — M542 Cervicalgia: Secondary | ICD-10-CM | POA: Diagnosis not present

## 2017-06-23 DIAGNOSIS — M9903 Segmental and somatic dysfunction of lumbar region: Secondary | ICD-10-CM | POA: Diagnosis not present

## 2017-06-23 DIAGNOSIS — M9901 Segmental and somatic dysfunction of cervical region: Secondary | ICD-10-CM | POA: Diagnosis not present

## 2017-06-25 ENCOUNTER — Encounter: Payer: Self-pay | Admitting: Internal Medicine

## 2017-06-25 NOTE — Progress Notes (Signed)
Diminutive adenoma Recall 2024

## 2017-10-28 NOTE — Progress Notes (Addendum)
Subjective:   Deanna Livingston is a 66 y.o. female who presents for an Initial Medicare Annual Wellness Visit.  Review of Systems    No ROS.  Medicare Wellness Visit. Additional risk factors are reflected in the social history.   Cardiac Risk Factors include: advanced age (>86men, >18 women);family history of premature cardiovascular disease;obesity (BMI >30kg/m2)   Sleep patterns: Sleeps 5-6 hours.  Home Safety/Smoke Alarms: Feels safe in home. Smoke alarms in place.  Living environment; residence and Firearm Safety: Lives with son (Leilani-friend) in split level home.  Seat Belt Safety/Bike Helmet: Wears seat belt.   Female:   [id number]      Mammo-06/04/2017, BI-RADS CATEGORY  1: Negative.         Dexa scan-11/15/2015, Osteopenia.  Postpone to 05/2018      [facility]-Colonoscopy 06/19/2017, polyps. Recall 5 years.      Objective:    Today's Vitals   10/29/17 1116  BP: 118/70  Pulse: 65  SpO2: 99%  Weight: 201 lb (91.2 kg)  Height: 5\' 4"  (1.626 m)   Body mass index is 34.5 kg/m.  Advanced Directives 10/29/2017 10/07/2014  Does Patient Have a Medical Advance Directive? Yes No  Type of Advance Directive Living will;Healthcare Power of Attorney -  [place] in Chart? No - copy requested -    Current Medications (verified) Outpatient Encounter Medications as of 10/29/2017  Medication Sig  . calcium carbonate 200 MG capsule Take 250 mg by mouth daily.  Marland Kitchen Cod Liver Oil CAPS Take 1 capsule by mouth daily.  Marland Kitchen GARLIC PO Take 1 capsule by mouth daily.  Marland Kitchen MILK THISTLE PO Take 1 tablet by mouth daily.  . Multiple Vitamin (MULTIVITAMIN) tablet Take 1 tablet by mouth daily.  . TURMERIC PO Take by mouth.  . [DISCONTINUED] bisacodyl (DULCOLAX) 5 MG EC tablet Take 5 mg by mouth once.   Facility-Administered Encounter Medications as of 10/29/2017  Medication  . 0.9 %  sodium chloride infusion    Allergies (verified) Naproxen   History: Past Medical  History:  Diagnosis Date  . GERD (gastroesophageal reflux disease)   . Personal history of colonic polyps-adenoma 01/21/2012   12/2011 5 mm adenoma and 3 mm polyp not recovered - repeat colon about 01/2017  . Thyroid disease    Past Surgical History:  Procedure Laterality Date  . [place]  . COLONOSCOPY  2013  . FOOT SURGERY    . left arm lipoma removed     Family History  Problem Relation Age of Onset  . Cancer Other   . Heart disease Mother   . Hypertension Mother   . Heart disease Father   . Hypertension Father   . Diabetes Father   . Lupus Sister   . Colon cancer Neg Hx   . Stomach cancer Neg Hx   . Rectal cancer Neg Hx   . Colon polyps Neg Hx    Social History   Socioeconomic History  . Marital status: Married    Spouse name: Not on file  . Number of children: Not on file  . Years of education: Not on file  . Highest education level: Not on file  Occupational History  . Not on file  Social Needs  . Financial resource strain: Not on file  . Food insecurity:    Worry: Not on file    Inability: Not on file  . Transportation needs:    Medical: Not on file  Non-medical: Not on file  Tobacco Use  . Smoking status: Never Smoker  . Smokeless tobacco: Never Used  Substance and Sexual Activity  . Alcohol use: No  . Drug use: No  . Sexual activity: Not on file  Lifestyle  . Physical activity:    Days per week: Not on file    Minutes per session: Not on file  . Stress: Not on file  Relationships  . Social connections:    Talks on phone: Not on file    Gets together: Not on file    Attends religious service: Not on file    Active member of club or organization: Not on file    Attends meetings of clubs or organizations: Not on file    Relationship status: Not on file  Other Topics Concern  . Not on file  Social History Narrative  . Not on file    Tobacco Counseling Counseling given: Not Answered   Activities of Daily Living In your  present state of health, do you have any difficulty performing the following activities: 10/29/2017 04/28/2017  Hearing? N N  Vision? N N  Difficulty concentrating or making decisions? N N  Walking or climbing stairs? N N  Dressing or bathing? N N  Doing errands, shopping? N N  Preparing Food and eating ? N -  Using the Toilet? N -  In the past six months, have you accidently leaked urine? N -  Do you have problems with loss of bowel control? N -  Managing your Medications? N -  Managing your Finances? N -  Housekeeping or managing your Housekeeping? N -  Some recent data might be hidden     Immunizations and Health Maintenance Immunization History  Administered Date(s) Administered  . Pneumococcal Conjugate-13 04/28/2017  . Tdap 10/07/2011   There are no preventive care reminders to display for this patient.  Patient Care Team: Midge Minium, MD as PCP - General (Family Medicine) Susie Cassette, [place] as Referring Physician (Chiropractic Medicine)  Indicate any recent Medical Services you may have received from other than [facility] providers in the past year (date may be approximate).     Assessment:   This is a routine wellness examination for Deanna Livingston.  Hearing/Vision screen Hearing Screening Comments: Able to hear conversational tones w/o difficulty. No issues reported.   Vision Screening Comments: Last exam 06/2017, [facility] yearly. Wears glasses.   Dietary issues and exercise activities discussed: Current Exercise Habits: Home exercise routine, Type of exercise: walking;strength training/weights, Time (Minutes): > 60, Frequency (Times/Week): 7, Weekly Exercise (Minutes/Week): 0, Exercise limited by: None identified   Diet (meal preparation, eat out, water intake, caffeinated beverages, dairy products, fruits and vegetables): Drinks water.   Breakfast: cereal; smoothie Lunch: sandwich; vegetables Dinner: protein and vegetables.   Goals    . Weight (lb)  < 185 lb (83.9 kg)     Lose weight by increasing meals to 5 small meals/day to increase metabolism.       Depression Screen PHQ 2/9 Scores 10/29/2017 04/28/2017 11/13/2015 09/26/2015 10/20/2014 04/29/2013  PHQ - 2 Score 0 0 0 1 0 0  PHQ- 9 Score - 0 - - - -    Fall Risk Fall Risk  10/29/2017 04/28/2017 11/13/2015 09/26/2015 10/20/2014  Falls in the past year? Yes No No No No  Comment tripped over cords - - - -  Number falls in past yr: 1 - - - -  Injury with Fall? No - - - -  Follow up Falls prevention discussed - - - -     Cognitive Function: MMSE - Mini Mental State Exam 10/29/2017  Orientation to time 5  Orientation to Place 5  Registration 3  Attention/ Calculation 5  Recall 3  Language- name 2 objects 2  Language- repeat 1  Language- follow 3 step command 3  Language- read & follow direction 1  Write a sentence 1  Copy design 1  Total score 30        Screening Tests Health Maintenance  Topic Date Due  . INFLUENZA VACCINE  12/29/2017 (Originally 11/20/2017)  . Hepatitis C Screening  04/28/2018 (Originally [birth year])  . DEXA SCAN  11/14/2017  . PNA vac Low Risk Adult (2 of 2 - PPSV23) 04/28/2018  . MAMMOGRAM  06/04/2018  . TETANUS/TDAP  10/06/2021  . COLONOSCOPY  06/19/2022        Plan:    Bring a copy of your living will and/or healthcare power of attorney to your next office visit.  Continue doing brain stimulating activities (puzzles, reading, adult coloring books, staying active) to keep memory sharp.   I have personally reviewed and noted the following in the patient's chart:   . Medical and social history . Use of alcohol, tobacco or illicit drugs  . Current medications and supplements . Functional ability and status . Nutritional status . Physical activity . Advanced directives . List of other physicians . Hospitalizations, surgeries, and ER visits in previous 12 months . Vitals . Screenings to include cognitive, depression, and falls . Referrals and  appointments  In addition, I have reviewed and discussed with patient certain preventive protocols, quality metrics, and best practice recommendations. A written personalized care plan for preventive services as well as general preventive health recommendations were provided to patient.     Gerilyn Nestle, RN   10/29/2017    PCP Notes: -Pt with no issues. Postponing DEXA to have with mammo in 05/2018.  -F/U with PCP in 04/2018  Reviewed documentation provided by RN and agree w/ above.  Annye Asa, MD

## 2017-10-29 ENCOUNTER — Other Ambulatory Visit: Payer: Self-pay

## 2017-10-29 ENCOUNTER — Ambulatory Visit (INDEPENDENT_AMBULATORY_CARE_PROVIDER_SITE_OTHER): Payer: Medicare Other

## 2017-10-29 VITALS — BP 118/70 | HR 65 | Ht 64.0 in | Wt 201.0 lb

## 2017-10-29 DIAGNOSIS — Z0000 Encounter for general adult medical examination without abnormal findings: Secondary | ICD-10-CM | POA: Diagnosis not present

## 2017-10-29 NOTE — Patient Instructions (Addendum)

## 2017-11-08 ENCOUNTER — Other Ambulatory Visit: Payer: Self-pay

## 2017-11-08 ENCOUNTER — Emergency Department (HOSPITAL_BASED_OUTPATIENT_CLINIC_OR_DEPARTMENT_OTHER): Payer: Medicare Other

## 2017-11-08 ENCOUNTER — Emergency Department (HOSPITAL_BASED_OUTPATIENT_CLINIC_OR_DEPARTMENT_OTHER)
Admission: EM | Admit: 2017-11-08 | Discharge: 2017-11-08 | Disposition: A | Discharge status: Home / Self Care | Payer: Medicare Other | Attending: Emergency Medicine | Admitting: Emergency Medicine

## 2017-11-08 ENCOUNTER — Encounter (HOSPITAL_BASED_OUTPATIENT_CLINIC_OR_DEPARTMENT_OTHER): Payer: Self-pay | Admitting: Emergency Medicine

## 2017-11-08 DIAGNOSIS — Z8719 Personal history of other diseases of the digestive system: Secondary | ICD-10-CM | POA: Insufficient documentation

## 2017-11-08 DIAGNOSIS — Z86010 Personal history of colonic polyps: Secondary | ICD-10-CM | POA: Diagnosis not present

## 2017-11-08 DIAGNOSIS — N281 Cyst of kidney, acquired: Secondary | ICD-10-CM | POA: Diagnosis not present

## 2017-11-08 DIAGNOSIS — Z79899 Other long term (current) drug therapy: Secondary | ICD-10-CM | POA: Insufficient documentation

## 2017-11-08 DIAGNOSIS — R079 Chest pain, unspecified: Secondary | ICD-10-CM | POA: Diagnosis not present

## 2017-11-08 DIAGNOSIS — R0789 Other chest pain: Secondary | ICD-10-CM

## 2017-11-08 LAB — D-DIMER, QUANTITATIVE: D-Dimer, Quant: 0.66 ug/mL-FEU — ABNORMAL HIGH (ref 0.00–0.50)

## 2017-11-08 LAB — CBC
HCT: 34.1 % — ABNORMAL LOW (ref 36.0–46.0)
Hemoglobin: 11.4 g/dL — ABNORMAL LOW (ref 12.0–15.0)
MCH: 29.8 pg (ref 26.0–34.0)
MCHC: 33.4 g/dL (ref 30.0–36.0)
MCV: 89 fL (ref 78.0–100.0)
PLATELETS: 253 10*3/uL (ref 150–400)
RBC: 3.83 MIL/uL — ABNORMAL LOW (ref 3.87–5.11)
RDW: 13 % (ref 11.5–15.5)
WBC: 9.4 10*3/uL (ref 4.0–10.5)

## 2017-11-08 LAB — BASIC METABOLIC PANEL
Anion gap: 6 (ref 5–15)
BUN: 18 mg/dL (ref 8–23)
CALCIUM: 9.9 mg/dL (ref 8.9–10.3)
CHLORIDE: 106 mmol/L (ref 98–111)
CO2: 25 mmol/L (ref 22–32)
CREATININE: 0.86 mg/dL (ref 0.44–1.00)
GFR calc Af Amer: >60 mL/min (ref >60)
GFR calc non Af Amer: >60 mL/min (ref >60)
GLUCOSE: 106 mg/dL — AB (ref 70–99)
Potassium: 3.4 mmol/L — ABNORMAL LOW (ref 3.5–5.1)
Sodium: 137 mmol/L (ref 135–145)

## 2017-11-08 LAB — TROPONIN I: Troponin I: <0.03 ng/mL (ref <0.03)

## 2017-11-08 MED ORDER — IOPAMIDOL (ISOVUE-370) INJECTION 76%
100.0000 mL | Freq: Once | INTRAVENOUS | Status: AC | PRN
  Administered 2017-11-08: 100 mL via INTRAVENOUS

## 2017-11-08 MED ORDER — KETOROLAC TROMETHAMINE 30 MG/ML IJ SOLN
30.0000 mg | Freq: Once | INTRAMUSCULAR | Status: AC
  Administered 2017-11-08: 30 mg via INTRAVENOUS
  Filled 2017-11-08: qty 1

## 2017-11-08 MED ORDER — HYDROCODONE-ACETAMINOPHEN 5-325 MG PO TABS: 1.0000 | ORAL_TABLET | Freq: Four times a day (QID) | ORAL | 0 refills | Status: DC | PRN

## 2017-11-08 MED ORDER — GI COCKTAIL ~~LOC~~
30.0000 mL | Freq: Once | ORAL | Status: AC
  Administered 2017-11-08: 30 mL via ORAL
  Filled 2017-11-08: qty 30

## 2017-11-08 NOTE — ED Provider Notes (Addendum)
[facility] EMERGENCY DEPARTMENT Provider Note   CSN: [id number] Arrival date & time: 11/08/17  0221     History   Chief Complaint Chief Complaint  Patient presents with  . Chest Pain    HPI Deanna Livingston is a 67 y.o. female.  Patient is a 67 year old female with past medical history of acid reflux.  She presents today for evaluation of chest discomfort.  She was asleep and rolled over, then developed sharp pain behind her left breast.  This is worse when she breathes.  She denies any shortness of breath, nausea, diaphoresis, or radiation to the arm or jaw.  She denies any prior cardiac history and has no cardiac risk factors.  She walks daily and denies any recent exertional symptoms.     Past Medical History:  Diagnosis Date  . GERD (gastroesophageal reflux disease)   . Personal history of colonic polyps-adenoma 01/21/2012   12/2011 5 mm adenoma and 3 mm polyp not recovered - repeat colon about 01/2017  . Thyroid disease     Patient Active Problem List   Diagnosis Date Noted  . Left hip pain 04/28/2017  . Osteopenia 11/13/2015  . Grief 09/26/2015  . Routine general medical examination at a health care facility 04/29/2013  . Personal history of colonic polyps-adenoma 01/21/2012  . Screening for malignant neoplasm of cervix 11/21/2011  . Thyroid disorder 10/07/2011  . Obesity 10/07/2011  . GERD (gastroesophageal reflux disease) 10/07/2011  . Soft tissue mass 10/07/2011    Past Surgical History:  Procedure Laterality Date  . [place]  . COLONOSCOPY  2013  . FOOT SURGERY    . left arm lipoma removed       OB History   None      Home Medications    Prior to Admission medications   Medication Sig Start Date End Date Taking? Authorizing Provider  calcium carbonate 200 MG capsule Take 250 mg by mouth daily.    [provider]  [facility] Liver Oil CAPS Take 1 capsule by mouth daily.    [provider]  GARLIC PO Take 1  capsule by mouth daily.    [provider]  MILK THISTLE PO Take 1 tablet by mouth daily.    [provider]  Multiple Vitamin (MULTIVITAMIN) tablet Take 1 tablet by mouth daily.    [provider]  TURMERIC PO Take by mouth.    [provider]    Family History Family History  Problem Relation Age of Onset  . Cancer Other   . Heart disease Mother   . Hypertension Mother   . Heart disease Father   . Hypertension Father   . Diabetes Father   . Lupus Sister   . Colon cancer Neg Hx   . Stomach cancer Neg Hx   . Rectal cancer Neg Hx   . Colon polyps Neg Hx     Social History Social History   Tobacco Use  . Smoking status: Never Smoker  . Smokeless tobacco: Never Used  Substance Use Topics  . Alcohol use: No  . Drug use: No     Allergies   Naproxen   Review of Systems Review of Systems  All other systems reviewed and are negative.    Physical Exam Updated Vital Signs BP (!) 144/84 (BP Location: Right Arm)   Temp 98.5 F (36.9 C) (Oral)   Resp 18   Ht 5\' 4"  (1.626 m)   Wt 91.2 kg (201  lb)   SpO2 100%   BMI 34.50 kg/m   Physical Exam  Constitutional: She is oriented to person, place, and time. She appears well-developed and well-nourished. No distress.  HENT:  Head: Normocephalic and atraumatic.  Neck: Normal range of motion. Neck supple.  Cardiovascular: Normal rate and regular rhythm. Exam reveals no gallop and no friction rub.  No murmur heard. Pulmonary/Chest: Effort normal and breath sounds normal. No respiratory distress. She has no wheezes. She has no rales.  Abdominal: Soft. Bowel sounds are normal. She exhibits no distension. There is no tenderness.  Musculoskeletal: Normal range of motion.  Neurological: She is alert and oriented to person, place, and time.  Skin: Skin is warm and dry. She is not diaphoretic.  Nursing note and vitals reviewed.    ED Treatments / Results  Labs (all labs ordered are listed,  but only abnormal results are displayed) Labs Reviewed  CBC - Abnormal; Notable for the following components:      Result Value   RBC 3.83 (*)    Hemoglobin 11.4 (*)    HCT 34.1 (*)    All other components within normal limits  BASIC METABOLIC PANEL  TROPONIN I    EKG EKG Interpretation  Date/Time:  Saturday November 08 2017 02:33:13 EDT Ventricular Rate:  65 PR Interval:    QRS Duration: 84 QT Interval:  399 QTC Calculation: 415 R Axis:   -12 Text Interpretation:  Sinus or ectopic atrial rhythm Prolonged PR interval Abnormal R-wave progression, early transition No significant change from 06/18/2011 Confirmed by Veryl Speak [phone]) on 11/08/2017 2:47:05 AM   Radiology No results found.  Procedures Procedures (including critical care time)  Medications Ordered in ED Medications  gi cocktail (Maalox,Lidocaine,Donnatal) (has no administration in time range)     Initial Impression / Assessment and Plan / ED Course  I have reviewed the triage vital signs and the nursing notes.  Pertinent labs & imaging results that were available during my care of the patient were reviewed by me and considered in my medical decision making (see chart for details).  Patient presents here with complaints of sharp pain behind her left breast.  This started abruptly while rolling over in bed.  Her pain is pleuritic in nature.  The work-up reveals no evidence for any acute pathology.  She has had a CT scan which has not shown a PE or other vascular issue.  Her EKG is unchanged and troponin x2 is negative.  By exclusion, this appears to be musculoskeletal.  She will be discharged with anti-inflammatories, pain medicine, and follow-up as needed.  [place] reviewed prior to prescription being written.  Final Clinical Impressions(s) / ED Diagnoses   Final diagnoses:  None    ED Discharge Orders    None       Veryl Speak, MD 11/08/17 [id number]    Veryl Speak, MD 11/08/17  [phone]

## 2017-11-08 NOTE — Discharge Instructions (Addendum)
Hydrocodone as prescribed as needed for pain.  Follow-up with your primary doctor if symptoms are not improving in the next week.

## 2017-11-08 NOTE — ED Triage Notes (Signed)
Patient states that she had some left chest pressure this evening then when she went to bed experienced left chest tightness worse with inspiration; nad noted, denies nvd.

## 2018-04-27 ENCOUNTER — Other Ambulatory Visit: Payer: Self-pay | Admitting: Family Medicine

## 2018-04-27 DIAGNOSIS — Z1231 Encounter for screening mammogram for malignant neoplasm of breast: Secondary | ICD-10-CM

## 2018-05-04 ENCOUNTER — Encounter: Payer: Self-pay | Admitting: Family Medicine

## 2018-05-04 ENCOUNTER — Ambulatory Visit (INDEPENDENT_AMBULATORY_CARE_PROVIDER_SITE_OTHER): Payer: Medicare Other | Admitting: Family Medicine

## 2018-05-04 ENCOUNTER — Other Ambulatory Visit: Payer: Self-pay

## 2018-05-04 VITALS — BP 126/78 | HR 76 | Temp 99.1°F | Resp 16 | Ht 64.0 in | Wt 205.0 lb

## 2018-05-04 DIAGNOSIS — E6609 Other obesity due to excess calories: Secondary | ICD-10-CM | POA: Diagnosis not present

## 2018-05-04 DIAGNOSIS — Z6835 Body mass index (BMI) 35.0-35.9, adult: Secondary | ICD-10-CM | POA: Diagnosis not present

## 2018-05-04 DIAGNOSIS — M7989 Other specified soft tissue disorders: Secondary | ICD-10-CM | POA: Diagnosis not present

## 2018-05-04 DIAGNOSIS — Z23 Encounter for immunization: Secondary | ICD-10-CM | POA: Diagnosis not present

## 2018-05-04 LAB — CBC WITH DIFFERENTIAL/PLATELET
BASOS PCT: 0.7 % (ref 0.0–3.0)
Basophils Absolute: 0.1 10*3/uL (ref 0.0–0.1)
EOS ABS: 0.1 10*3/uL (ref 0.0–0.7)
EOS PCT: 1.2 % (ref 0.0–5.0)
HCT: 36.5 % (ref 36.0–46.0)
Hemoglobin: 12.2 g/dL (ref 12.0–15.0)
LYMPHS ABS: 2 10*3/uL (ref 0.7–4.0)
Lymphocytes Relative: 23.1 % (ref 12.0–46.0)
MCHC: 33.5 g/dL (ref 30.0–36.0)
MCV: 88.5 fl (ref 78.0–100.0)
MONO ABS: 1 10*3/uL (ref 0.1–1.0)
Monocytes Relative: 11.1 % (ref 3.0–12.0)
NEUTROS ABS: 5.6 10*3/uL (ref 1.4–7.7)
Neutrophils Relative %: 63.9 % (ref 43.0–77.0)
PLATELETS: 256 10*3/uL (ref 150.0–400.0)
RBC: 4.13 Mil/uL (ref 3.87–5.11)
RDW: 13.6 % (ref 11.5–15.5)
WBC: 8.7 10*3/uL (ref 4.0–10.5)

## 2018-05-04 LAB — BASIC METABOLIC PANEL
BUN: 14 mg/dL (ref 6–23)
CO2: 31 mEq/L (ref 19–32)
Calcium: 11.2 mg/dL — ABNORMAL HIGH (ref 8.4–10.5)
Chloride: 104 mEq/L (ref 96–112)
Creatinine, Ser: 1.04 mg/dL (ref 0.40–1.20)
GFR: 67.96 mL/min (ref >60.00)
Glucose, Bld: 82 mg/dL (ref 70–99)
POTASSIUM: 4.9 meq/L (ref 3.5–5.1)
SODIUM: 139 meq/L (ref 135–145)

## 2018-05-04 LAB — HEPATIC FUNCTION PANEL
ALK PHOS: 82 U/L (ref 39–117)
ALT: 13 U/L (ref 0–35)
AST: 17 U/L (ref 0–37)
Albumin: 4 g/dL (ref 3.5–5.2)
BILIRUBIN DIRECT: 0.1 mg/dL (ref 0.0–0.3)
BILIRUBIN TOTAL: 0.5 mg/dL (ref 0.2–1.2)
Total Protein: 7.2 g/dL (ref 6.0–8.3)

## 2018-05-04 LAB — LIPID PANEL
CHOL/HDL RATIO: 5
Cholesterol: 216 mg/dL — ABNORMAL HIGH (ref 0–200)
HDL: 47.1 mg/dL (ref >39.00)
LDL Cholesterol: 151 mg/dL — ABNORMAL HIGH (ref 0–99)
NONHDL: 168.73
TRIGLYCERIDES: 90 mg/dL (ref 0.0–149.0)
VLDL: 18 mg/dL (ref 0.0–40.0)

## 2018-05-04 NOTE — Addendum Note (Signed)
Addended by: Davis Gourd on: 05/04/2018 01:45 PM   Modules accepted: Orders

## 2018-05-04 NOTE — Assessment & Plan Note (Signed)
Pt has area that appears to be a cyst at base of her neck on upper back.  She feels this is enlarging and would like this removed.  Will refer to derm for evaluation and tx.  Pt expressed understanding and is in agreement w/ plan.

## 2018-05-04 NOTE — Progress Notes (Signed)
   Subjective:    Patient ID: Deanna Livingston, female    DOB: 1950-12-14, 68 y.o.   MRN: [id number]  HPI Obesity- ongoing issue for pt.  BMI is now 35.19  Pt is doing weights and walking regularly.  No CP, SOB, HAs, visual changes, abd pain, N/V.  Health Maintenance- UTD on mammo, colonoscopy, Tdap.  Due for Pneumovax  Lump on back of neck- pt reports this has grown somewhat since she first noticed it.  No drainage.  No pain but discomfort when lying on hard surface.   Review of Systems For ROS see HPI     Objective:   Physical Exam Vitals signs reviewed.  Constitutional:      General: She is not in acute distress.    Appearance: She is well-developed. She is obese.  HENT:     Head: Normocephalic and atraumatic.  Eyes:     Conjunctiva/sclera: Conjunctivae normal.     Pupils: Pupils are equal, round, and reactive to light.  Neck:     Musculoskeletal: Normal range of motion and neck supple.     Thyroid: No thyromegaly.  Cardiovascular:     Rate and Rhythm: Normal rate and regular rhythm.     Heart sounds: Normal heart sounds. No murmur.  Pulmonary:     Effort: Pulmonary effort is normal. No respiratory distress.     Breath sounds: Normal breath sounds.  Abdominal:     General: There is no distension.     Palpations: Abdomen is soft.     Tenderness: There is no abdominal tenderness.  Lymphadenopathy:     Cervical: No cervical adenopathy.  Skin:    General: Skin is warm and dry.  Neurological:     Mental Status: She is alert and oriented to person, place, and time.  Psychiatric:        Behavior: Behavior normal.           Assessment & Plan:

## 2018-05-04 NOTE — Patient Instructions (Signed)
Follow up in 1 year or as needed We'll notify you of your lab results and make any changes if needed Continue to work on healthy diet and regular exercise- you're doing great!! Call with any questions or concerns Happy New Year!!!

## 2018-05-04 NOTE — Assessment & Plan Note (Signed)
Deteriorated.  Pt has gained some weight and BMI is now 35.19.  She has recently started exercising- applauded her efforts.  Encouraged healthy diet.  Check labs to risk stratify.  Will follow.

## 2018-05-05 ENCOUNTER — Other Ambulatory Visit: Payer: Self-pay | Admitting: General Practice

## 2018-05-05 ENCOUNTER — Other Ambulatory Visit: Payer: Self-pay | Admitting: Family Medicine

## 2018-05-05 DIAGNOSIS — E785 Hyperlipidemia, unspecified: Secondary | ICD-10-CM

## 2018-05-05 MED ORDER — ATORVASTATIN CALCIUM 10 MG PO TABS: 10.0000 mg | ORAL_TABLET | Freq: Every day | ORAL | 6 refills | Status: DC

## 2018-05-21 DIAGNOSIS — D235 Other benign neoplasm of skin of trunk: Secondary | ICD-10-CM | POA: Diagnosis not present

## 2018-06-08 ENCOUNTER — Ambulatory Visit
Admission: RE | Admit: 2018-06-08 | Discharge: 2018-06-08 | Disposition: A | Discharge status: Home / Self Care | Payer: BC Managed Care – PPO | Source: Ambulatory Visit | Attending: Family Medicine | Admitting: Family Medicine

## 2018-06-08 ENCOUNTER — Other Ambulatory Visit: Payer: Self-pay | Admitting: Family Medicine

## 2018-06-08 DIAGNOSIS — Z1231 Encounter for screening mammogram for malignant neoplasm of breast: Secondary | ICD-10-CM

## 2018-06-16 ENCOUNTER — Other Ambulatory Visit (INDEPENDENT_AMBULATORY_CARE_PROVIDER_SITE_OTHER): Payer: BC Managed Care – PPO

## 2018-06-16 DIAGNOSIS — E785 Hyperlipidemia, unspecified: Secondary | ICD-10-CM | POA: Diagnosis not present

## 2018-06-16 LAB — HEPATIC FUNCTION PANEL
ALBUMIN: 4 g/dL (ref 3.5–5.2)
ALK PHOS: 77 U/L (ref 39–117)
ALT: 16 U/L (ref 0–35)
AST: 20 U/L (ref 0–37)
Bilirubin, Direct: 0.1 mg/dL (ref 0.0–0.3)
Total Bilirubin: 0.6 mg/dL (ref 0.2–1.2)
Total Protein: 7.1 g/dL (ref 6.0–8.3)

## 2018-06-17 ENCOUNTER — Other Ambulatory Visit: Payer: Self-pay | Admitting: General Practice

## 2018-06-17 LAB — PTH, INTACT AND CALCIUM
Calcium: 10.8 mg/dL — ABNORMAL HIGH (ref 8.6–10.4)
PTH: 26 pg/mL (ref 14–64)

## 2018-06-17 MED ORDER — ATORVASTATIN CALCIUM 10 MG PO TABS: 10.0000 mg | ORAL_TABLET | Freq: Every day | ORAL | 1 refills | Status: DC

## 2018-06-18 ENCOUNTER — Encounter: Payer: Self-pay | Admitting: General Practice

## 2018-09-23 ENCOUNTER — Ambulatory Visit: Payer: Self-pay | Admitting: Family Medicine

## 2018-09-23 NOTE — Telephone Encounter (Signed)
My son just found out one of his co-workers is positive for COVID-19.   My son lives with me.   He does not have symptoms and neither do I.   His job is requiring everyone in the household he lives with has to be tested for the COVID-19.    I called into Dr Virgil Benedict office and spoke with Levada Dy regarding this situation.   They are not writing orders for testing on people who have not been exposed or showing symptoms.   She might try a [facility] or [facility].  I let the pt know this.   I also told her to contact the [facility] too.   She said she would follow up and see who could do the test even though she does not have symptoms.

## 2018-11-03 NOTE — Progress Notes (Addendum)
Subjective:   Deanna Livingston is a 68 y.o. female who presents for Medicare Annual (Subsequent) preventive examination.  Review of Systems:  No ROS.  Medicare Wellness Visit.  See social history for additional risk factors.   Cardiac Risk Factors include: advanced age (>60men, >54 women);family history of premature cardiovascular disease;obesity (BMI >30kg/m2)   Sleep patterns: Sleeps 5-7 hours.  Home Safety/Smoke Alarms: Feels safe in home. Smoke alarms in place.  Living environment; residence and Firearm Safety: Son lives with patient in split level home.  Seat Belt Safety/Bike Helmet: Wears seat belt.   Female:   [id number]       Mammo-06/08/2018, BI-RADS CATEGORY  1: Negative.       Dexa scan-11/15/2015, Osteopenia.  Postpone to 05/2019 [facility]-Colonoscopy 06/19/2017, polyps. Recall 5 years.      Objective:     Vitals: BP 128/80 (BP Location: Left Arm, Patient Position: Sitting, Cuff Size: Normal)   Pulse 77   Ht 5\' 4"  (1.626 m)   Wt 206 lb 4 oz (93.6 kg)   SpO2 98%   BMI 35.40 kg/m   Body mass index is 35.4 kg/m.  Advanced Directives 11/04/2018 11/08/2017 10/29/2017 10/07/2014  Does Patient Have a Medical Advance Directive? Yes No Yes No  Type of Advance Directive Living will;Healthcare Power of Attorney - Living will;Healthcare Power of Attorney -  [place] in Chart? No - copy requested No - copy requested No - copy requested -  Would patient like information on creating a medical advance directive? - No - Patient declined - -    Tobacco Social History   Tobacco Use  Smoking Status Never Smoker  Smokeless Tobacco Never Used     Counseling given: Not Answered    Past Medical History:  Diagnosis Date  . GERD (gastroesophageal reflux disease)   . Personal history of colonic polyps-adenoma 01/21/2012   12/2011 5 mm adenoma and 3 mm polyp not recovered - repeat colon about 01/2017  . Thyroid disease    Past Surgical History:  Procedure  Laterality Date  . Bl[place]  . COLONOSCOPY  2013  . FOOT SURGERY    . left arm lipoma removed     Family History  Problem Relation Age of Onset  . Cancer Other   . Heart disease Mother   . Hypertension Mother   . Heart disease Father   . Hypertension Father   . Diabetes Father   . Lupus Sister   . Colon cancer Neg Hx   . Stomach cancer Neg Hx   . Rectal cancer Neg Hx   . Colon polyps Neg Hx   . Breast cancer Neg Hx    Social History   Socioeconomic History  . Marital status: Married    Spouse name: Not on file  . Number of children: Not on file  . Years of education: Not on file  . Highest education level: Not on file  Occupational History  . Not on file  Social Needs  . Financial resource strain: Not on file  . Food insecurity    Worry: Not on file    Inability: Not on file  . Transportation needs    Medical: Not on file    Non-medical: Not on file  Tobacco Use  . Smoking status: Never Smoker  . Smokeless tobacco: Never Used  Substance and Sexual Activity  . Alcohol use: No  . Drug use: No  . Sexual activity: Not on file  Lifestyle  . Physical activity    Days per week: Not on file    Minutes per session: Not on file  . Stress: Not on file  Relationships  . Social Herbalist on phone: Not on file    Gets together: Not on file    Attends religious service: Not on file    Active member of club or organization: Not on file    Attends meetings of clubs or organizations: Not on file    Relationship status: Not on file  Other Topics Concern  . Not on file  Social History Narrative  . Not on file    Outpatient Encounter Medications as of 11/04/2018  Medication Sig  . Acetylcysteine (NAC) 500 MG CAPS Take by mouth.  Marland Kitchen atorvastatin (LIPITOR) 10 MG tablet Take 1 tablet (10 mg total) by mouth daily.  . calcium carbonate 200 MG capsule Take 250 mg by mouth daily.  Marland Kitchen Cod Liver Oil CAPS Take 1 capsule by mouth daily.  Marland Kitchen GARLIC PO Take 1  capsule by mouth daily.  Marland Kitchen MILK THISTLE PO Take 1 tablet by mouth daily.  . Multiple Vitamin (MULTIVITAMIN) tablet Take 1 tablet by mouth daily.  . TURMERIC PO Take by mouth.  . Zinc 25 MG TABS Take by mouth.   Facility-Administered Encounter Medications as of 11/04/2018  Medication  . 0.9 %  sodium chloride infusion    Activities of Daily Living In your present state of health, do you have any difficulty performing the following activities: 11/04/2018  Hearing? N  Vision? N  Difficulty concentrating or making decisions? N  Walking or climbing stairs? N  Dressing or bathing? N  Doing errands, shopping? N  Preparing Food and eating ? N  Using the Toilet? N  In the past six months, have you accidently leaked urine? N  Do you have problems with loss of bowel control? N  Managing your Medications? N  Managing your Finances? N  Housekeeping or managing your Housekeeping? N  Some recent data might be hidden    Patient Care Team: Midge Minium, MD as PCP - General (Family Medicine) Susie Cassette, [place] as Referring Physician (Chiropractic Medicine)    Assessment:   This is a routine wellness examination for Deanna Livingston.  Exercise Activities and Dietary recommendations Current Exercise Habits: Home exercise routine, Type of exercise: Other - see comments;walking;strength training/weights(biking;), Frequency (Times/Week): 5, Exercise limited by: None identified   Diet (meal preparation, eat out, water intake, caffeinated beverages, dairy products, fruits and vegetables): Drinks water.  Heart healthy 2-3 meals/day.    Goals      Patient Stated   . Weight (lb) < 200 lb (90.7 kg) (pt-stated)      Other   . Weight (lb) < 185 lb (83.9 kg)     Lose weight by increasing meals to 5 small meals/day to increase metabolism.        Fall Risk Fall Risk  11/04/2018 05/04/2018 10/29/2017 04/28/2017 11/13/2015  Falls in the past year? 0 0 Yes No No  Comment - - tripped over cords - -  Number  falls in past yr: - - 1 - -  Injury with Fall? - - No - -  Follow up - - Falls prevention discussed - -    Depression Screen PHQ 2/9 Scores 11/04/2018 05/04/2018 10/29/2017 04/28/2017  PHQ - 2 Score 0 0 0 0  PHQ- 9 Score - - - 0     Cognitive Function MMSE -  Mini Mental State Exam 11/04/2018 10/29/2017  Orientation to time 5 5  Orientation to Place 5 5  Registration 3 3  Attention/ Calculation 5 5  Recall 3 3  Language- name 2 objects 2 2  Language- repeat 1 1  Language- follow 3 step command 3 3  Language- read & follow direction 1 1  Write a sentence 1 1  Copy design 1 1  Total score 30 30        Immunization History  Administered Date(s) Administered  . Pneumococcal Conjugate-13 04/28/2017  . Pneumococcal Polysaccharide-23 05/04/2018  . Tdap 10/07/2011   Declines Shingrix.   Screening Tests Health Maintenance  Topic Date Due  . Hepatitis C Screening  05/05/2019 (Originally [birth year])  . DEXA SCAN  05/24/2019 (Originally 11/14/2017)  . INFLUENZA VACCINE  11/21/2018  . MAMMOGRAM  06/09/2019  . TETANUS/TDAP  10/06/2021  . COLONOSCOPY  06/19/2022  . PNA vac Low Risk Adult  Completed        Plan:    Bring a copy of your living will and/or healthcare power of attorney to your next office visit.  Continue doing brain stimulating activities (puzzles, reading, adult coloring books, staying active) to keep memory sharp.   I have personally reviewed and noted the following in the patient's chart:   . Medical and social history . Use of alcohol, tobacco or illicit drugs  . Current medications and supplements . Functional ability and status . Nutritional status . Physical activity . Advanced directives . List of other physicians . Hospitalizations, surgeries, and ER visits in previous 12 months . Vitals . Screenings to include cognitive, depression, and falls . Referrals and appointments  In addition, I have reviewed and discussed with patient certain preventive  protocols, quality metrics, and best practice recommendations. A written personalized care plan for preventive services as well as general preventive health recommendations were provided to patient.     Gerilyn Nestle, RN  11/04/2018   F/U with PCP 04/2019.  Postponing DEXA to have with Mammo 05/2019  Reviewed documentation provided by RN and agree w/ above.  Annye Asa, MD

## 2018-11-04 ENCOUNTER — Other Ambulatory Visit: Payer: Self-pay

## 2018-11-04 ENCOUNTER — Ambulatory Visit (INDEPENDENT_AMBULATORY_CARE_PROVIDER_SITE_OTHER): Payer: Medicare Other

## 2018-11-04 VITALS — BP 128/80 | HR 77 | Ht 64.0 in | Wt 206.2 lb

## 2018-11-04 DIAGNOSIS — E669 Obesity, unspecified: Secondary | ICD-10-CM

## 2018-11-04 DIAGNOSIS — Z0000 Encounter for general adult medical examination without abnormal findings: Secondary | ICD-10-CM

## 2018-11-04 NOTE — Patient Instructions (Addendum)
Bring a copy of your living will and/or healthcare power of attorney to your next office visit.  Continue doing brain stimulating activities (puzzles, reading, adult coloring books, staying active) to keep memory sharp.    Fall Prevention in the Home, Adult Falls can cause injuries. They can happen to people of all ages. There are many things you can do to make your home safe and to help prevent falls. Ask for help when making these changes, if needed. What actions can I take to prevent falls? General Instructions  Use good lighting in all rooms. Replace any light bulbs that burn out.  Turn on the lights when you go into a dark area. Use night-lights.  Keep items that you use often in easy-to-reach places. Lower the shelves around your home if necessary.  Set up your furniture so you have a clear path. Avoid moving your furniture around.  Do not have throw rugs and other things on the floor that can make you trip.  Avoid walking on wet floors.  If any of your floors are uneven, fix them.  Add color or contrast paint or tape to clearly mark and help you see: ? Any grab bars or handrails. ? First and last steps of stairways. ? Where the edge of each step is.  If you use a stepladder: ? Make sure that it is fully opened. Do not climb a closed stepladder. ? Make sure that both sides of the stepladder are locked into place. ? Ask someone to hold the stepladder for you while you use it.  If there are any pets around you, be aware of where they are. What can I do in the bathroom?      Keep the floor dry. Clean up any water that spills onto the floor as soon as it happens.  Remove soap buildup in the tub or shower regularly.  Use non-skid mats or decals on the floor of the tub or shower.  Attach bath mats securely with double-sided, non-slip rug tape.  If you need to sit down in the shower, use a plastic, non-slip stool.  Install grab bars by the toilet and in the tub and  shower. Do not use towel bars as grab bars. What can I do in the bedroom?  Make sure that you have a light by your bed that is easy to reach.  Do not use any sheets or blankets that are too big for your bed. They should not hang down onto the floor.  Have a firm chair that has side arms. You can use this for support while you get dressed. What can I do in the kitchen?  Clean up any spills right away.  If you need to reach something above you, use a strong step stool that has a grab bar.  Keep electrical cords out of the way.  Do not use floor polish or wax that makes floors slippery. If you must use wax, use non-skid floor wax. What can I do with my stairs?  Do not leave any items on the stairs.  Make sure that you have a light switch at the top of the stairs and the bottom of the stairs. If you do not have them, ask someone to add them for you.  Make sure that there are handrails on both sides of the stairs, and use them. Fix handrails that are broken or loose. Make sure that handrails are as long as the stairways.  Install non-slip stair treads on all  stairs in your home.  Avoid having throw rugs at the top or bottom of the stairs. If you do have throw rugs, attach them to the floor with carpet tape.  Choose a carpet that does not hide the edge of the steps on the stairway.  Check any carpeting to make sure that it is firmly attached to the stairs. Fix any carpet that is loose or worn. What can I do on the outside of my home?  Use bright outdoor lighting.  Regularly fix the edges of walkways and driveways and fix any cracks.  Remove anything that might make you trip as you walk through a door, such as a raised step or threshold.  Trim any bushes or trees on the path to your home.  Regularly check to see if handrails are loose or broken. Make sure that both sides of any steps have handrails.  Install guardrails along the edges of any raised decks and porches.  Clear  walking paths of anything that might make someone trip, such as tools or rocks.  Have any leaves, snow, or ice cleared regularly.  Use sand or salt on walking paths during winter.  Clean up any spills in your garage right away. This includes grease or oil spills. What other actions can I take?  Wear shoes that: ? Have a low heel. Do not wear high heels. ? Have rubber bottoms. ? Are comfortable and fit you well. ? Are closed at the toe. Do not wear open-toe sandals.  Use tools that help you move around (mobility aids) if they are needed. These include: ? Canes. ? Walkers. ? Scooters. ? Crutches.  Review your medicines with your doctor. Some medicines can make you feel dizzy. This can increase your chance of falling. Ask your doctor what other things you can do to help prevent falls. Where to find more information  Centers for Disease Control and Prevention, STEADI: https://garcia.biz/  [employer] on Aging: BrainJudge.co.uk Contact a doctor if:  You are afraid of falling at home.  You feel weak, drowsy, or dizzy at home.  You fall at home. Summary  There are many simple things that you can do to make your home safe and to help prevent falls.  Ways to make your home safe include removing tripping hazards and installing grab bars in the bathroom.  Ask for help when making these changes in your home. This information is not intended to replace advice given to you by your health care provider. Make sure you discuss any questions you have with your health care provider. Document Released: 02/02/2009 Document Revised: 07/30/2018 Document Reviewed: 11/21/2016 Elsevier Patient Education  2020 [place] Maintenance, Female Adopting a healthy lifestyle and getting preventive care are important in promoting health and wellness. Ask your health care provider about:  The right schedule for you to have regular tests and exams.  Things you can do on  your own to prevent diseases and keep yourself healthy. What should I know about diet, weight, and exercise? Eat a healthy diet   Eat a diet that includes plenty of vegetables, fruits, low-fat dairy products, and lean protein.  Do not eat a lot of foods that are high in solid fats, added sugars, or sodium. Maintain a healthy weight Body mass index (BMI) is used to identify weight problems. It estimates body fat based on height and weight. Your health care provider can help determine your BMI and help you achieve or maintain a healthy weight.  Get regular exercise Get regular exercise. This is one of the most important things you can do for your health. Most adults should:  Exercise for at least 150 minutes each week. The exercise should increase your heart rate and make you sweat (moderate-intensity exercise).  Do strengthening exercises at least twice a week. This is in addition to the moderate-intensity exercise.  Spend less time sitting. Even light physical activity can be beneficial. Watch cholesterol and blood lipids Have your blood tested for lipids and cholesterol at 68 years of age, then have this test every 5 years. Have your cholesterol levels checked more often if:  Your lipid or cholesterol levels are high.  You are older than 68 years of age.  You are at high risk for heart disease. What should I know about cancer screening? Depending on your health history and family history, you may need to have cancer screening at various ages. This may include screening for:  Breast cancer.  Cervical cancer.  Colorectal cancer.  Skin cancer.  Lung cancer. What should I know about heart disease, diabetes, and high blood pressure? Blood pressure and heart disease  High blood pressure causes heart disease and increases the risk of stroke. This is more likely to develop in people who have high blood pressure readings, are of African descent, or are overweight.  Have your blood  pressure checked: ? Every 3-5 years if you are 59-46 years of age. ? Every year if you are 1 years old or older. Diabetes Have regular diabetes screenings. This checks your fasting blood sugar level. Have the screening done:  Once every three years after age 69 if you are at a normal weight and have a low risk for diabetes.  More often and at a younger age if you are overweight or have a high risk for diabetes. What should I know about preventing infection? Hepatitis B If you have a higher risk for hepatitis B, you should be screened for this virus. Talk with your health care provider to find out if you are at risk for hepatitis B infection. Hepatitis C Testing is recommended for:  Everyone born from [birth year] through 1965.  Anyone with known risk factors for hepatitis C. Sexually transmitted infections (STIs)  Get screened for STIs, including gonorrhea and chlamydia, if: ? You are sexually active and are younger than 68 years of age. ? You are older than 68 years of age and your health care provider tells you that you are at risk for this type of infection. ? Your sexual activity has changed since you were last screened, and you are at increased risk for chlamydia or gonorrhea. Ask your health care provider if you are at risk.  Ask your health care provider about whether you are at high risk for HIV. Your health care provider may recommend a prescription medicine to help prevent HIV infection. If you choose to take medicine to prevent HIV, you should first get tested for HIV. You should then be tested every 3 months for as long as you are taking the medicine. Pregnancy  If you are about to stop having your period (premenopausal) and you may become pregnant, seek counseling before you get pregnant.  Take 400 to 800 micrograms (mcg) of folic acid every day if you become pregnant.  Ask for birth control (contraception) if you want to prevent pregnancy. Osteoporosis and menopause  Osteoporosis is a disease in which the bones lose minerals and strength with aging. This can result in bone  fractures. If you are 52 years old or older, or if you are at risk for osteoporosis and fractures, ask your health care provider if you should:  Be screened for bone loss.  Take a calcium or vitamin D supplement to lower your risk of fractures.  Be given hormone replacement therapy (HRT) to treat symptoms of menopause. Follow these instructions at home: Lifestyle  Do not use any products that contain nicotine or tobacco, such as cigarettes, e-cigarettes, and chewing tobacco. If you need help quitting, ask your health care provider.  Do not use street drugs.  Do not share needles.  Ask your health care provider for help if you need support or information about quitting drugs. Alcohol use  Do not drink alcohol if: ? Your health care provider tells you not to drink. ? You are pregnant, may be pregnant, or are planning to become pregnant.  If you drink alcohol: ? Limit how much you use to 0-1 drink a day. ? Limit intake if you are breastfeeding.  Be aware of how much alcohol is in your drink. In the [place] one drink equals one 12 oz bottle of beer (355 mL), one 5 oz glass of wine (148 mL), or one 1 oz glass of hard liquor (44 mL). General instructions  Schedule regular health, dental, and eye exams.  Stay current with your vaccines.  Tell your health care provider if: ? You often feel depressed. ? You have ever been abused or do not feel safe at home. Summary  Adopting a healthy lifestyle and getting preventive care are important in promoting health and wellness.  Follow your health care provider's instructions about healthy diet, exercising, and getting tested or screened for diseases.  Follow your health care provider's instructions on monitoring your cholesterol and blood pressure. This information is not intended to replace advice given to you by your health care  provider. Make sure you discuss any questions you have with your health care provider. Document Released: 10/22/2010 Document Revised: 04/01/2018 Document Reviewed: 04/01/2018 Elsevier Patient Education  2020 [employer].

## 2019-05-05 ENCOUNTER — Other Ambulatory Visit: Payer: Self-pay | Admitting: Family Medicine

## 2019-05-05 DIAGNOSIS — Z1231 Encounter for screening mammogram for malignant neoplasm of breast: Secondary | ICD-10-CM

## 2019-05-07 ENCOUNTER — Encounter: Payer: Self-pay | Admitting: Family Medicine

## 2019-05-07 ENCOUNTER — Ambulatory Visit (INDEPENDENT_AMBULATORY_CARE_PROVIDER_SITE_OTHER): Payer: Medicare Other | Admitting: Family Medicine

## 2019-05-07 ENCOUNTER — Other Ambulatory Visit: Payer: Self-pay

## 2019-05-07 VITALS — BP 128/81 | HR 77 | Temp 97.9°F | Resp 16 | Ht 65.0 in | Wt 203.0 lb

## 2019-05-07 DIAGNOSIS — Z6833 Body mass index (BMI) 33.0-33.9, adult: Secondary | ICD-10-CM | POA: Diagnosis not present

## 2019-05-07 DIAGNOSIS — M8580 Other specified disorders of bone density and structure, unspecified site: Secondary | ICD-10-CM | POA: Diagnosis not present

## 2019-05-07 DIAGNOSIS — E2839 Other primary ovarian failure: Secondary | ICD-10-CM

## 2019-05-07 DIAGNOSIS — L720 Epidermal cyst: Secondary | ICD-10-CM

## 2019-05-07 DIAGNOSIS — E6609 Other obesity due to excess calories: Secondary | ICD-10-CM | POA: Diagnosis not present

## 2019-05-07 DIAGNOSIS — E785 Hyperlipidemia, unspecified: Secondary | ICD-10-CM | POA: Diagnosis not present

## 2019-05-07 LAB — HEPATIC FUNCTION PANEL
ALT: 18 U/L (ref 0–35)
AST: 21 U/L (ref 0–37)
Albumin: 4.1 g/dL (ref 3.5–5.2)
Alkaline Phosphatase: 79 U/L (ref 39–117)
Bilirubin, Direct: 0.1 mg/dL (ref 0.0–0.3)
Total Bilirubin: 0.5 mg/dL (ref 0.2–1.2)
Total Protein: 7.1 g/dL (ref 6.0–8.3)

## 2019-05-07 LAB — CBC WITH DIFFERENTIAL/PLATELET
Basophils Absolute: 0.1 10*3/uL (ref 0.0–0.1)
Basophils Relative: 1 % (ref 0.0–3.0)
Eosinophils Absolute: 0.2 10*3/uL (ref 0.0–0.7)
Eosinophils Relative: 2.9 % (ref 0.0–5.0)
HCT: 37.9 % (ref 36.0–46.0)
Hemoglobin: 12.5 g/dL (ref 12.0–15.0)
Lymphocytes Relative: 29.3 % (ref 12.0–46.0)
Lymphs Abs: 2.1 10*3/uL (ref 0.7–4.0)
MCHC: 32.9 g/dL (ref 30.0–36.0)
MCV: 88.8 fl (ref 78.0–100.0)
Monocytes Absolute: 0.6 10*3/uL (ref 0.1–1.0)
Monocytes Relative: 8.9 % (ref 3.0–12.0)
Neutro Abs: 4.2 10*3/uL (ref 1.4–7.7)
Neutrophils Relative %: 57.9 % (ref 43.0–77.0)
Platelets: 251 10*3/uL (ref 150.0–400.0)
RBC: 4.27 Mil/uL (ref 3.87–5.11)
RDW: 13.6 % (ref 11.5–15.5)
WBC: 7.2 10*3/uL (ref 4.0–10.5)

## 2019-05-07 LAB — BASIC METABOLIC PANEL
BUN: 10 mg/dL (ref 6–23)
CO2: 27 mEq/L (ref 19–32)
Calcium: 10.3 mg/dL (ref 8.4–10.5)
Chloride: 104 mEq/L (ref 96–112)
Creatinine, Ser: 0.79 mg/dL (ref 0.40–1.20)
GFR: 87.55 mL/min (ref >60.00)
Glucose, Bld: 86 mg/dL (ref 70–99)
Potassium: 4.5 mEq/L (ref 3.5–5.1)
Sodium: 137 mEq/L (ref 135–145)

## 2019-05-07 LAB — LIPID PANEL
Cholesterol: 192 mg/dL (ref 0–200)
HDL: 47.8 mg/dL (ref >39.00)
LDL Cholesterol: 133 mg/dL — ABNORMAL HIGH (ref 0–99)
NonHDL: 144.09
Total CHOL/HDL Ratio: 4
Triglycerides: 55 mg/dL (ref 0.0–149.0)
VLDL: 11 mg/dL (ref 0.0–40.0)

## 2019-05-07 LAB — TSH: TSH: 1.84 u[IU]/mL (ref 0.35–4.50)

## 2019-05-07 NOTE — Assessment & Plan Note (Signed)
Repeat DEXA ordered

## 2019-05-07 NOTE — Patient Instructions (Addendum)
Schedule your Wellness Visit in 6 months We'll notify you of your lab results and make any changes if needed Continue to work on healthy diet and regular exercise- you're doing great! Apply hot compresses to the cyst on your back to draw it to the surface They'll call you with your bone density appt Call with any questions or concerns Stay Safe!  Stay Healthy!

## 2019-05-07 NOTE — Assessment & Plan Note (Signed)
New.  Pt has hx of very large one on upper back that was surgically removed.  This one is much smaller and very superficial.  Some debris was removed by using steady pressure on either side.  Encouraged hot compresses to bring any of the remaining debris to the surface.  Pt expressed understanding and is in agreement w/ plan.

## 2019-05-07 NOTE — Assessment & Plan Note (Signed)
Ongoing issue.  Applauded her efforts at regular exercise and dietary changes.  Check labs to risk stratify.  Will follow.

## 2019-05-07 NOTE — Assessment & Plan Note (Signed)
Chronic problem, was on Atorvastatin until she ran out months ago.  Never refilled this.  Check labs and restart meds prn.

## 2019-05-07 NOTE — Progress Notes (Signed)
   Subjective:    Patient ID: Deanna Livingston, female    DOB: January 02, 1951, 69 y.o.   MRN: [id number]  HPI Hyperlipidemia- Pt stopped Atorvastatin when prescription ran out.  Reports exercising regularly.  No CP, SOB, HAs, visual changes, abd pain, N/V, edema.  Obesity- pt's BMI is 33.78.  Pt reports she is exercising regularly.  Pt is line dancing and walking.  Reports feeling 'real good'.  Has cut out all fried foods.  Not currently eating red meat or dairy.  Soft tissue mass- on back, pt feels area is growing.  First noticed 'a couple of weeks ago'.  Had similar area on upper back that had to be surgically removed by derm.        Review of Systems For ROS see HPI   This visit occurred during the SARS-CoV-2 public health emergency.  Safety protocols were in place, including screening questions prior to the visit, additional usage of staff PPE, and extensive cleaning of exam room while observing appropriate contact time as indicated for disinfecting solutions.       Objective:   Physical Exam Vitals reviewed.  Constitutional:      General: She is not in acute distress.    Appearance: She is well-developed. She is obese.  HENT:     Head: Normocephalic and atraumatic.  Eyes:     Conjunctiva/sclera: Conjunctivae normal.     Pupils: Pupils are equal, round, and reactive to light.  Neck:     Thyroid: No thyromegaly.  Cardiovascular:     Rate and Rhythm: Normal rate and regular rhythm.     Heart sounds: Normal heart sounds. No murmur.  Pulmonary:     Effort: Pulmonary effort is normal. No respiratory distress.     Breath sounds: Normal breath sounds.  Abdominal:     General: There is no distension.     Palpations: Abdomen is soft.     Tenderness: There is no abdominal tenderness.  Musculoskeletal:     Cervical back: Normal range of motion and neck supple.  Lymphadenopathy:     Cervical: No cervical adenopathy.  Skin:    General: Skin is warm and dry.     Comments: Small  epidermal inclusion cyst at bra line- some debris was expressed w/ steady pressure on either side  Neurological:     Mental Status: She is alert and oriented to person, place, and time.  Psychiatric:        Behavior: Behavior normal.           Assessment & Plan:

## 2019-05-10 ENCOUNTER — Encounter: Payer: Self-pay | Admitting: General Practice

## 2019-06-11 ENCOUNTER — Other Ambulatory Visit: Payer: Self-pay

## 2019-06-11 ENCOUNTER — Ambulatory Visit
Admission: RE | Admit: 2019-06-11 | Discharge: 2019-06-11 | Disposition: A | Discharge status: Home / Self Care | Payer: Medicare Other | Source: Ambulatory Visit | Attending: Family Medicine | Admitting: Family Medicine

## 2019-06-11 DIAGNOSIS — Z1231 Encounter for screening mammogram for malignant neoplasm of breast: Secondary | ICD-10-CM

## 2019-08-04 ENCOUNTER — Ambulatory Visit
Admission: RE | Admit: 2019-08-04 | Discharge: 2019-08-04 | Disposition: A | Discharge status: Home / Self Care | Payer: Medicare Other | Source: Ambulatory Visit | Attending: Family Medicine | Admitting: Family Medicine

## 2019-08-04 ENCOUNTER — Other Ambulatory Visit: Payer: Self-pay

## 2019-08-04 DIAGNOSIS — Z780 Asymptomatic menopausal state: Secondary | ICD-10-CM | POA: Diagnosis not present

## 2019-08-04 DIAGNOSIS — M810 Age-related osteoporosis without current pathological fracture: Secondary | ICD-10-CM | POA: Diagnosis not present

## 2019-08-04 DIAGNOSIS — M8580 Other specified disorders of bone density and structure, unspecified site: Secondary | ICD-10-CM

## 2019-08-04 DIAGNOSIS — E2839 Other primary ovarian failure: Secondary | ICD-10-CM

## 2019-08-05 ENCOUNTER — Other Ambulatory Visit: Payer: Self-pay | Admitting: General Practice

## 2019-08-05 MED ORDER — ALENDRONATE SODIUM 70 MG PO TABS: 70.0000 mg | ORAL_TABLET | ORAL | 3 refills | Status: DC

## 2019-09-17 ENCOUNTER — Telehealth: Payer: Self-pay | Admitting: Family Medicine

## 2019-09-17 NOTE — Telephone Encounter (Signed)
Patient is in [place] visiting family - She will not be back until 7/15.  States that her fosamax  that she takes every Friday will run out 2 weeks before she comes home.  Would like to know if it will hurt to skip two weeks for if she needs to have another rx sent out there.  Please call.

## 2019-09-17 NOTE — Telephone Encounter (Signed)
No worries on missing a few doses of Fosamax.  Can just resume when she gets home

## 2019-09-17 NOTE — Telephone Encounter (Signed)
Please advise 

## 2019-09-17 NOTE — Telephone Encounter (Signed)
Pt notified and stated an understanding.

## 2019-10-05 ENCOUNTER — Telehealth: Payer: Self-pay | Admitting: Family Medicine

## 2019-10-05 NOTE — Telephone Encounter (Signed)
LVM to r/s appt.  Please resched appt on Mon or Tues with [facility] -Caroleen Hamman, RN

## 2019-10-26 ENCOUNTER — Ambulatory Visit (INDEPENDENT_AMBULATORY_CARE_PROVIDER_SITE_OTHER): Payer: Medicare Other

## 2019-10-26 VITALS — Ht 65.0 in | Wt 203.0 lb

## 2019-10-26 DIAGNOSIS — Z0000 Encounter for general adult medical examination without abnormal findings: Secondary | ICD-10-CM

## 2019-10-26 NOTE — Patient Instructions (Signed)
Deanna Livingston , Thank you for taking time to come for your Medicare Wellness Visit. I appreciate your ongoing commitment to your health goals. Please review the following plan we discussed and let me know if I can assist you in the future.   Screening recommendations/referrals: Colonoscopy: Completed 06/20/3027-Due 06/20/2027 Mammogram: Completed 06/11/2019 Due 06/10/2020 Bone Density: Completed 08/04/2019 Due 08/03/2021 Recommended yearly ophthalmology/optometry visit for glaucoma screening and checkup Recommended yearly dental visit for hygiene and checkup  Vaccinations: Influenza vaccine: Declined flu vaccines Pneumococcal vaccine: Completed vaccines Tdap vaccine: Up to date Due 10/06/2021 Shingles vaccine: Discuss with pharmacy   Covid-19:Declined vaccines  Advanced directives: Please bring a copy to your next office visit  Conditions/risks identified: See problem list  Next appointment: Follow up in one year for your annual wellness visit  10/31/2020 @ 3:00pm   Preventive Care 69 Years and Older, Female Preventive care refers to lifestyle choices and visits with your health care provider that can promote health and wellness. What does preventive care include?  A yearly physical exam. This is also called an annual well check.  Dental exams once or twice a year.  Routine eye exams. Ask your health care provider how often you should have your eyes checked.  Personal lifestyle choices, including:  Daily care of your teeth and gums.  Regular physical activity.  Eating a healthy diet.  Avoiding tobacco and drug use.  Limiting alcohol use.  Practicing safe sex.  Taking low-dose aspirin every day.  Taking vitamin and mineral supplements as recommended by your health care provider. What happens during an annual well check? The services and screenings done by your health care provider during your annual well check will depend on your age, overall health, lifestyle risk factors, and  family history of disease. Counseling  Your health care provider may ask you questions about your:  Alcohol use.  Tobacco use.  Drug use.  Emotional well-being.  Home and relationship well-being.  Sexual activity.  Eating habits.  History of falls.  Memory and ability to understand (cognition).  Work and work Statistician.  Reproductive health. Screening  You may have the following tests or measurements:  Height, weight, and BMI.  Blood pressure.  Lipid and cholesterol levels. These may be checked every 5 years, or more frequently if you are over 32 years old.  Skin check.  Lung cancer screening. You may have this screening every year starting at age 41 if you have a 30-pack-year history of smoking and currently smoke or have quit within the past 15 years.  Fecal occult blood test (FOBT) of the stool. You may have this test every year starting at age 56.  Flexible sigmoidoscopy or colonoscopy. You may have a sigmoidoscopy every 5 years or a colonoscopy every 10 years starting at age 53.  Hepatitis C blood test.  Hepatitis B blood test.  Sexually transmitted disease (STD) testing.  Diabetes screening. This is done by checking your blood sugar (glucose) after you have not eaten for a while (fasting). You may have this done every 1-3 years.  Bone density scan. This is done to screen for osteoporosis. You may have this done starting at age 22.  Mammogram. This may be done every 1-2 years. Talk to your health care provider about how often you should have regular mammograms. Talk with your health care provider about your test results, treatment options, and if necessary, the need for more tests. Vaccines  Your health care provider may recommend certain vaccines, such as:  Influenza vaccine. This is recommended every year.  Tetanus, diphtheria, and acellular pertussis (Tdap, Td) vaccine. You may need a Td booster every 10 years.  Zoster vaccine. You may need this  after age 38.  Pneumococcal 13-valent conjugate (PCV13) vaccine. One dose is recommended after age 56.  Pneumococcal polysaccharide (PPSV23) vaccine. One dose is recommended after age 88. Talk to your health care provider about which screenings and vaccines you need and how often you need them. This information is not intended to replace advice given to you by your health care provider. Make sure you discuss any questions you have with your health care provider. Document Released: 05/05/2015 Document Revised: 12/27/2015 Document Reviewed: 02/07/2015 Elsevier Interactive Patient Education  2017 [place] Prevention in the Home Falls can cause injuries. They can happen to people of all ages. There are many things you can do to make your home safe and to help prevent falls. What can I do on the outside of my home?  Regularly fix the edges of walkways and driveways and fix any cracks.  Remove anything that might make you trip as you walk through a door, such as a raised step or threshold.  Trim any bushes or trees on the path to your home.  Use bright outdoor lighting.  Clear any walking paths of anything that might make someone trip, such as rocks or tools.  Regularly check to see if handrails are loose or broken. Make sure that both sides of any steps have handrails.  Any raised decks and porches should have guardrails on the edges.  Have any leaves, snow, or ice cleared regularly.  Use sand or salt on walking paths during winter.  Clean up any spills in your garage right away. This includes oil or grease spills. What can I do in the bathroom?  Use night lights.  Install grab bars by the toilet and in the tub and shower. Do not use towel bars as grab bars.  Use non-skid mats or decals in the tub or shower.  If you need to sit down in the shower, use a plastic, non-slip stool.  Keep the floor dry. Clean up any water that spills on the floor as soon as it  happens.  Remove soap buildup in the tub or shower regularly.  Attach bath mats securely with double-sided non-slip rug tape.  Do not have throw rugs and other things on the floor that can make you trip. What can I do in the bedroom?  Use night lights.  Make sure that you have a light by your bed that is easy to reach.  Do not use any sheets or blankets that are too big for your bed. They should not hang down onto the floor.  Have a firm chair that has side arms. You can use this for support while you get dressed.  Do not have throw rugs and other things on the floor that can make you trip. What can I do in the kitchen?  Clean up any spills right away.  Avoid walking on wet floors.  Keep items that you use a lot in easy-to-reach places.  If you need to reach something above you, use a strong step stool that has a grab bar.  Keep electrical cords out of the way.  Do not use floor polish or wax that makes floors slippery. If you must use wax, use non-skid floor wax.  Do not have throw rugs and other things on the floor that  can make you trip. What can I do with my stairs?  Do not leave any items on the stairs.  Make sure that there are handrails on both sides of the stairs and use them. Fix handrails that are broken or loose. Make sure that handrails are as long as the stairways.  Check any carpeting to make sure that it is firmly attached to the stairs. Fix any carpet that is loose or worn.  Avoid having throw rugs at the top or bottom of the stairs. If you do have throw rugs, attach them to the floor with carpet tape.  Make sure that you have a light switch at the top of the stairs and the bottom of the stairs. If you do not have them, ask someone to add them for you. What else can I do to help prevent falls?  Wear shoes that:  Do not have high heels.  Have rubber bottoms.  Are comfortable and fit you well.  Are closed at the toe. Do not wear sandals.  If you  use a stepladder:  Make sure that it is fully opened. Do not climb a closed stepladder.  Make sure that both sides of the stepladder are locked into place.  Ask someone to hold it for you, if possible.  Clearly mark and make sure that you can see:  Any grab bars or handrails.  First and last steps.  Where the edge of each step is.  Use tools that help you move around (mobility aids) if they are needed. These include:  Canes.  Walkers.  Scooters.  Crutches.  Turn on the lights when you go into a dark area. Replace any light bulbs as soon as they burn out.  Set up your furniture so you have a clear path. Avoid moving your furniture around.  If any of your floors are uneven, fix them.  If there are any pets around you, be aware of where they are.  Review your medicines with your doctor. Some medicines can make you feel dizzy. This can increase your chance of falling. Ask your doctor what other things that you can do to help prevent falls. This information is not intended to replace advice given to you by your health care provider. Make sure you discuss any questions you have with your health care provider. Document Released: 02/02/2009 Document Revised: 09/14/2015 Document Reviewed: 05/13/2014 Elsevier Interactive Patient Education  2017 [employer].

## 2019-10-26 NOTE — Progress Notes (Signed)
Subjective:   Deanna Livingston is a 69 y.o. female who presents for Medicare Annual (Subsequent) preventive examination.  I connected with Kaidyn today by telephone and verified that I am speaking with the correct person using two identifiers. Location patient: home Location provider: work Persons participating in the virtual visit: patient, Marine scientist.    I discussed the limitations, risks, security and privacy concerns of performing an evaluation and management service by telephone and the availability of in person appointments. I also discussed with the patient that there may be a patient responsible charge related to this service. The patient expressed understanding and verbally consented to this telephonic visit.    Interactive audio and video telecommunications were attempted between this provider and patient, however failed, due to patient having technical difficulties OR patient did not have access to video capability.  We continued and completed visit with audio only.  Some vital signs may be absent or patient reported.   Time Spent with patient on telephone encounter: 30 minutes  Review of Systems     Cardiac Risk Factors include: advanced age (>23men, >103 women);dyslipidemia;hypertension;obesity (BMI >30kg/m2)     Objective:    Today's Vitals   10/26/19 1531  Weight: 203 lb (92.1 kg)  Height: 5\' 5"  (1.651 m)   Body mass index is 33.78 kg/m.  Advanced Directives 10/26/2019 11/04/2018 11/08/2017 10/29/2017 10/07/2014  Does Patient Have a Medical Advance Directive? No;Yes Yes No Yes No  Type of Advance Directive Living will;Healthcare Power of Attorney Living will;Healthcare Power of Attorney - Living will;Healthcare Power of Attorney -  Copy of [place] in Chart? No - copy requested No - copy requested No - copy requested No - copy requested -  Would patient like information on creating a medical advance directive? - - No - Patient declined - -     Current Medications (verified) Outpatient Encounter Medications as of 10/26/2019  Medication Sig  . Acetylcysteine (NAC) 500 MG CAPS Take by mouth.  Marland Kitchen alendronate (FOSAMAX) 70 MG tablet Take 1 tablet (70 mg total) by mouth every 7 (seven) days. Take with a full glass of water on an empty stomach.  . Ascorbic Acid (VITAMIN C PO) Take by mouth.  . calcium carbonate 200 MG capsule Take 250 mg by mouth daily.  Marland Kitchen Cod Liver Oil CAPS Take 1 capsule by mouth daily.  Marland Kitchen GARLIC PO Take 1 capsule by mouth daily.  Marland Kitchen MAGNESIUM PO Take by mouth.  . Multiple Vitamin (MULTIVITAMIN) tablet Take 1 tablet by mouth daily.  . Pyridoxine HCl (B-6 PO) Take by mouth.  . TURMERIC PO Take by mouth.  . Zinc 25 MG TABS Take by mouth.  Marland Kitchen atorvastatin (LIPITOR) 10 MG tablet Take 1 tablet (10 mg total) by mouth daily. (Patient not taking: Reported on 10/26/2019)   No facility-administered encounter medications on file as of 10/26/2019.    Allergies (verified) Naproxen   History: Past Medical History:  Diagnosis Date  . GERD (gastroesophageal reflux disease)   . Personal history of colonic polyps-adenoma 01/21/2012   12/2011 5 mm adenoma and 3 mm polyp not recovered - repeat colon about 01/2017  . Thyroid disease    Past Surgical History:  Procedure Laterality Date  . [place]  . COLONOSCOPY  2013  . FOOT SURGERY    . left arm lipoma removed     Family History  Problem Relation Age of Onset  . Cancer Other   . Heart disease Mother   .  Hypertension Mother   . Heart disease Father   . Hypertension Father   . Diabetes Father   . Lupus Sister   . Colon cancer Neg Hx   . Stomach cancer Neg Hx   . Rectal cancer Neg Hx   . Colon polyps Neg Hx   . Breast cancer Neg Hx    Social History   Socioeconomic History  . Marital status: Widowed    Spouse name: Not on file  . Number of children: Not on file  . Years of education: Not on file  . Highest education level: Not on file  Occupational  History  . Occupation: Retired  Tobacco Use  . Smoking status: Never Smoker  . Smokeless tobacco: Never Used  Vaping Use  . Vaping Use: Never used  Substance and Sexual Activity  . Alcohol use: No  . Drug use: No  . Sexual activity: Not on file  Other Topics Concern  . Not on file  Social History Narrative  . Not on file   Social Determinants of Health   Financial Resource Strain: Low Risk   . Difficulty of Paying Living Expenses: Not hard at all  Food Insecurity: No Food Insecurity  . Worried About Charity fundraiser in the Last Year: Never true  . Ran Out of Food in the Last Year: Never true  Transportation Needs: No Transportation Needs  . Lack of Transportation (Medical): No  . Lack of Transportation (Non-Medical): No  Physical Activity: Sufficiently Active  . Days of Exercise per Week: 6 days  . Minutes of Exercise per Session: 30 min  Stress: No Stress Concern Present  . Feeling of Stress : Not at all  Social Connections: Moderately Integrated  . Frequency of Communication with Friends and Family: More than three times a week  . Frequency of Social Gatherings with Friends and Family: More than three times a week  . Attends Religious Services: More than 4 times per year  . Active Member of Clubs or Organizations: Yes  . Attends Archivist Meetings: More than 4 times per year  . Marital Status: Widowed    Tobacco Counseling Counseling given: Not Answered   Clinical Intake:  Pre-visit preparation completed: Yes  Pain : No/denies pain     Nutritional Status: BMI > 30  Obese Nutritional Risks: None Diabetes: No  How often do you need to have someone help you when you read instructions, pamphlets, or other written materials from your doctor or pharmacy?: 1 - Never  Diabetic?no  Interpreter Needed?: No  Information entered by :: Caroleen Hamman LPN   Activities of Daily Living In your present state of health, do you have any difficulty  performing the following activities: 10/26/2019 05/07/2019  Hearing? N N  Vision? N N  Difficulty concentrating or making decisions? N N  Walking or climbing stairs? N N  Dressing or bathing? N N  Doing errands, shopping? N N  Preparing Food and eating ? N -  Using the Toilet? N -  In the past six months, have you accidently leaked urine? N -  Do you have problems with loss of bowel control? N -  Managing your Medications? N -  Managing your Finances? N -  Housekeeping or managing your Housekeeping? N -  Some recent data might be hidden    Patient Care Team: Midge Minium, MD as PCP - General (Family Medicine) Susie Cassette, [place] as Referring Physician (Chiropractic Medicine)  Indicate any recent Medical  Services you may have received from other than [facility] providers in the past year (date may be approximate).     Assessment:   This is a routine wellness examination for Milee.  Hearing/Vision screen  Hearing Screening   125Hz  250Hz  500Hz  1000Hz  2000Hz  3000Hz  4000Hz  6000Hz  8000Hz   Right ear:           Left ear:           Comments: No hearing loss  Vision Screening Comments: Has an upcoming appt in August 2021-[facility]  Dietary issues and exercise activities discussed: Current Exercise Habits: The patient does not participate in regular exercise at present;Home exercise routine, Type of exercise: calisthenics;walking, Time (Minutes): 30, Frequency (Times/Week): 6, Weekly Exercise (Minutes/Week): 180, Intensity: Mild, Exercise limited by: None identified  Goals      Patient Stated   .  Weight (lb) < 200 lb (90.7 kg) (pt-stated)      Other   .  Patient Stated      Maintain current health    .  Weight (lb) < 185 lb (83.9 kg)      Lose weight by increasing meals to 5 small meals/day to increase metabolism.       Depression Screen PHQ 2/9 Scores 10/26/2019 05/07/2019 11/04/2018 05/04/2018 10/29/2017 04/28/2017 11/13/2015  PHQ - 2 Score 0 0 0 0 0 0 0  PHQ- 9 Score - 0 - - - 0  -    Fall Risk Fall Risk  10/26/2019 05/07/2019 11/04/2018 05/04/2018 10/29/2017  Falls in the past year? 0 0 0 0 Yes  Comment - - - - tripped over cords  Number falls in past yr: 0 0 - - 1  Injury with Fall? 0 0 - - No  Follow up Falls prevention discussed Falls evaluation completed - - Falls prevention discussed    Any stairs in or around the home? Yes  If so, are there any without handrails? No  Home free of loose throw rugs in walkways, pet beds, electrical cords, etc? No  Adequate lighting in your home to reduce risk of falls? No   ASSISTIVE DEVICES UTILIZED TO PREVENT FALLS:  Life alert? No  Use of a cane, walker or w/c? No  Grab bars in the bathroom? Yes  Shower chair or bench in shower? No  Elevated toilet seat or a handicapped toilet? No   TIMED UP AND GO:  Was the test performed? No . Virtual visit   Cognitive Function: MMSE - Mini Mental State Exam 11/04/2018 10/29/2017  Orientation to time 5 5  Orientation to Place 5 5  Registration 3 3  Attention/ Calculation 5 5  Recall 3 3  Language- name 2 objects 2 2  Language- repeat 1 1  Language- follow 3 step command 3 3  Language- read & follow direction 1 1  Write a sentence 1 1  Copy design 1 1  Total score 30 30     6CIT Screen 10/26/2019  What Year? 0 points  What month? 0 points  What time? 0 points  Count back from 20 0 points  Months in reverse 0 points  Repeat phrase 0 points  Total Score 0    Immunizations Immunization History  Administered Date(s) Administered  . Pneumococcal Conjugate-13 04/28/2017  . Pneumococcal Polysaccharide-23 05/04/2018  . Tdap 10/07/2011    TDAP status: Up to date   Flu Vaccine status: Declined, Education has been provided regarding the importance of this vaccine but patient still declined. Advised may  receive this vaccine at local pharmacy or Health Dept. Aware to provide a copy of the vaccination record if obtained from local pharmacy or Health Dept. Verbalized  acceptance and understanding.   Pneumococcal vaccine status: Up to date   Covid-19 vaccine status: Declined, Education has been provided regarding the importance of this vaccine but patient still declined. Advised may receive this vaccine at local pharmacy or Health Dept.or vaccine clinic. Aware to provide a copy of the vaccination record if obtained from local pharmacy or Health Dept. Verbalized acceptance and understanding.  Qualifies for Shingles Vaccine? Yes   Zostavax completed No   Shingrix Completed?: No.    Education has been provided regarding the importance of this vaccine. Patient has been advised to call insurance company to determine out of pocket expense if they have not yet received this vaccine. Advised may also receive vaccine at local pharmacy or Health Dept. Verbalized acceptance and understanding.  Screening Tests Health Maintenance  Topic Date Due  . COVID-19 Vaccine (1) 11/11/2019 (Originally 04/01/1963)  . Hepatitis C Screening  05/06/2020 (Originally [birth year])  . INFLUENZA VACCINE  11/21/2019  . MAMMOGRAM  06/10/2020  . DEXA SCAN  08/03/2021  . TETANUS/TDAP  10/06/2021  . COLONOSCOPY  06/19/2022  . PNA vac Low Risk Adult  Completed    Health Maintenance  There are no preventive care reminders to display for this patient.  Colorectal cancer screening: Completed 06/19/2017. Repeat every 10 years Mammogram status: Completed 06/11/2019. Repeat every year   Bone Density status: Completed 08/04/2019. Results reflect: Bone density results: OSTEOPOROSIS. Repeat every 2 years.  Lung Cancer Screening: (Low Dose CT Chest recommended if Age 29-80 years, 30 pack-year currently smoking OR have quit w/in 15years.) does not qualify.    Additional Screening:  Hepatitis C Screening: does qualify; Discuss with PCP  Vision Screening: Recommended annual ophthalmology exams for early detection of glaucoma and other disorders of the eye. Is the patient up to date with their  annual eye exam?  Yes  Who is the provider or what is the name of the office in which the patient attends annual eye exams? [facility]   Dental Screening: Recommended annual dental exams for proper oral hygiene  Community Resource Referral / Chronic Care Management: CRR required this visit?  No   CCM required this visit?  No      Plan:     I have personally reviewed and noted the following in the patient's chart:   . Medical and social history . Use of alcohol, tobacco or illicit drugs  . Current medications and supplements . Functional ability and status . Nutritional status . Physical activity . Advanced directives . List of other physicians . Hospitalizations, surgeries, and ER visits in previous 12 months . Vitals . Screenings to include cognitive, depression, and falls . Referrals and appointments  In addition, I have reviewed and discussed with patient certain preventive protocols, quality metrics, and best practice recommendations. A written personalized care plan for preventive services as well as general preventive health recommendations were provided to patient.  Due to this being a telephonic visit, the after visit summary with patients personalized plan was offered to patient via mail or my-chart. Patient preferred to pick up at office at next visit.     Marta Antu, LPN   04/23/2480  Nurse Health Advisor  Nurse Notes:

## 2019-11-08 ENCOUNTER — Ambulatory Visit (INDEPENDENT_AMBULATORY_CARE_PROVIDER_SITE_OTHER): Payer: Medicare Other | Admitting: Family Medicine

## 2019-11-08 ENCOUNTER — Encounter: Payer: Self-pay | Admitting: Family Medicine

## 2019-11-08 ENCOUNTER — Other Ambulatory Visit: Payer: Self-pay

## 2019-11-08 VITALS — BP 121/82 | HR 78 | Temp 97.9°F | Resp 16 | Ht 65.0 in | Wt 195.2 lb

## 2019-11-08 DIAGNOSIS — L720 Epidermal cyst: Secondary | ICD-10-CM

## 2019-11-08 DIAGNOSIS — E6609 Other obesity due to excess calories: Secondary | ICD-10-CM

## 2019-11-08 DIAGNOSIS — Z6832 Body mass index (BMI) 32.0-32.9, adult: Secondary | ICD-10-CM | POA: Diagnosis not present

## 2019-11-08 DIAGNOSIS — E785 Hyperlipidemia, unspecified: Secondary | ICD-10-CM | POA: Diagnosis not present

## 2019-11-08 LAB — CBC WITH DIFFERENTIAL/PLATELET
Basophils Absolute: 0.1 10*3/uL (ref 0.0–0.1)
Basophils Relative: 0.8 % (ref 0.0–3.0)
Eosinophils Absolute: 0.2 10*3/uL (ref 0.0–0.7)
Eosinophils Relative: 2.6 % (ref 0.0–5.0)
HCT: 35.4 % — ABNORMAL LOW (ref 36.0–46.0)
Hemoglobin: 11.8 g/dL — ABNORMAL LOW (ref 12.0–15.0)
Lymphocytes Relative: 25 % (ref 12.0–46.0)
Lymphs Abs: 1.7 10*3/uL (ref 0.7–4.0)
MCHC: 33.4 g/dL (ref 30.0–36.0)
MCV: 89 fl (ref 78.0–100.0)
Monocytes Absolute: 0.7 10*3/uL (ref 0.1–1.0)
Monocytes Relative: 10 % (ref 3.0–12.0)
Neutro Abs: 4.2 10*3/uL (ref 1.4–7.7)
Neutrophils Relative %: 61.6 % (ref 43.0–77.0)
Platelets: 237 10*3/uL (ref 150.0–400.0)
RBC: 3.98 Mil/uL (ref 3.87–5.11)
RDW: 13.6 % (ref 11.5–15.5)
WBC: 6.9 10*3/uL (ref 4.0–10.5)

## 2019-11-08 LAB — LIPID PANEL
Cholesterol: 202 mg/dL — ABNORMAL HIGH (ref 0–200)
HDL: 45.4 mg/dL (ref >39.00)
LDL Cholesterol: 139 mg/dL — ABNORMAL HIGH (ref 0–99)
NonHDL: 156.55
Total CHOL/HDL Ratio: 4
Triglycerides: 90 mg/dL (ref 0.0–149.0)
VLDL: 18 mg/dL (ref 0.0–40.0)

## 2019-11-08 LAB — BASIC METABOLIC PANEL
BUN: 11 mg/dL (ref 6–23)
CO2: 31 mEq/L (ref 19–32)
Calcium: 10.3 mg/dL (ref 8.4–10.5)
Chloride: 106 mEq/L (ref 96–112)
Creatinine, Ser: 0.77 mg/dL (ref 0.40–1.20)
GFR: 90.04 mL/min (ref >60.00)
Glucose, Bld: 84 mg/dL (ref 70–99)
Potassium: 4.3 mEq/L (ref 3.5–5.1)
Sodium: 141 mEq/L (ref 135–145)

## 2019-11-08 LAB — HEPATIC FUNCTION PANEL
ALT: 12 U/L (ref 0–35)
AST: 16 U/L (ref 0–37)
Albumin: 4 g/dL (ref 3.5–5.2)
Alkaline Phosphatase: 61 U/L (ref 39–117)
Bilirubin, Direct: 0.1 mg/dL (ref 0.0–0.3)
Total Bilirubin: 0.5 mg/dL (ref 0.2–1.2)
Total Protein: 6.9 g/dL (ref 6.0–8.3)

## 2019-11-08 LAB — TSH: TSH: 2.3 u[IU]/mL (ref 0.35–4.50)

## 2019-11-08 NOTE — Patient Instructions (Signed)
Follow up in 6 months to recheck cholesterol and weight loss progress We'll notify you of your lab results and make any changes if needed We'll call you with your Dermatology appt Continue to work on healthy diet and regular exercise- you look great! Call with any questions or concerns Have a great summer!!

## 2019-11-08 NOTE — Assessment & Plan Note (Signed)
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  Starting to have discomfort.  No evidence of infxn.  Would like referral back to Derm.  Referral placed.

## 2019-11-08 NOTE — Progress Notes (Signed)
   Subjective:    Patient ID: Deanna Livingston, female    DOB: [birth year], 69 y.o.   MRN: [id number]  HPI Obesity- pt is down 8 lbs since last visit.  Pt reports this is a combo of healthy diet and increased physical activity.  Hyperlipidemia- chronic problem, not currently taking Lipitor.  Is down 8 lbs.  No CP, SOB, abd pain, N/V.  Epidermal cyst- on back, 'it's getting a little bit bigger'.  Starting to be uncomfortable.  No drainage.  Son 'tried to squeeze it and nothing came out'.   Review of Systems For ROS see HPI   This visit occurred during the SARS-CoV-2 public health emergency.  Safety protocols were in place, including screening questions prior to the visit, additional usage of staff PPE, and extensive cleaning of exam room while observing appropriate contact time as indicated for disinfecting solutions.       Objective:   Physical Exam Vitals reviewed.  Constitutional:      General: She is not in acute distress.    Appearance: She is well-developed.  HENT:     Head: Normocephalic and atraumatic.  Eyes:     Conjunctiva/sclera: Conjunctivae normal.     Pupils: Pupils are equal, round, and reactive to light.  Neck:     Thyroid: No thyromegaly.  Cardiovascular:     Rate and Rhythm: Normal rate and regular rhythm.     Heart sounds: Normal heart sounds. No murmur heard.   Pulmonary:     Effort: Pulmonary effort is normal. No respiratory distress.     Breath sounds: Normal breath sounds.  Abdominal:     General: There is no distension.     Palpations: Abdomen is soft.     Tenderness: There is no abdominal tenderness.  Musculoskeletal:     Cervical back: Normal range of motion and neck supple.  Lymphadenopathy:     Cervical: No cervical adenopathy.  Skin:    General: Skin is warm and dry.     Comments: 1 cm epidermal inclusion cyst at bra line.  No drainage.  Neurological:     Mental Status: She is alert and oriented to person, place, and time.    Psychiatric:        Behavior: Behavior normal.           Assessment & Plan:

## 2019-11-08 NOTE — Assessment & Plan Note (Signed)
Improving.  Pt is down 8 lbs since last visit.  Applauded her efforts at healthy diet and regular exercise.  Will continue to follow.

## 2019-11-08 NOTE — Assessment & Plan Note (Signed)
Chronic problem.  Not currently on statin.  Down 8 lbs since last visit.  Applauded her efforts.  Check labs and adjust tx plan prn.

## 2019-11-09 ENCOUNTER — Encounter: Payer: Self-pay | Admitting: General Practice

## 2019-11-10 ENCOUNTER — Ambulatory Visit: Payer: Medicare Other

## 2019-11-12 DIAGNOSIS — L720 Epidermal cyst: Secondary | ICD-10-CM | POA: Diagnosis not present

## 2019-12-08 DIAGNOSIS — H5203 Hypermetropia, bilateral: Secondary | ICD-10-CM | POA: Diagnosis not present

## 2019-12-08 DIAGNOSIS — H524 Presbyopia: Secondary | ICD-10-CM | POA: Diagnosis not present

## 2019-12-08 DIAGNOSIS — H259 Unspecified age-related cataract: Secondary | ICD-10-CM | POA: Diagnosis not present

## 2019-12-08 DIAGNOSIS — H52223 Regular astigmatism, bilateral: Secondary | ICD-10-CM | POA: Diagnosis not present

## 2019-12-21 DIAGNOSIS — H25812 Combined forms of age-related cataract, left eye: Secondary | ICD-10-CM | POA: Diagnosis not present

## 2019-12-21 DIAGNOSIS — Z01818 Encounter for other preprocedural examination: Secondary | ICD-10-CM | POA: Diagnosis not present

## 2020-01-05 DIAGNOSIS — H2512 Age-related nuclear cataract, left eye: Secondary | ICD-10-CM | POA: Diagnosis not present

## 2020-01-05 DIAGNOSIS — H25812 Combined forms of age-related cataract, left eye: Secondary | ICD-10-CM | POA: Diagnosis not present

## 2020-02-16 DIAGNOSIS — H2511 Age-related nuclear cataract, right eye: Secondary | ICD-10-CM | POA: Diagnosis not present

## 2020-05-03 ENCOUNTER — Other Ambulatory Visit: Payer: Self-pay | Admitting: Family Medicine

## 2020-05-03 DIAGNOSIS — Z1231 Encounter for screening mammogram for malignant neoplasm of breast: Secondary | ICD-10-CM

## 2020-05-08 ENCOUNTER — Telehealth (INDEPENDENT_AMBULATORY_CARE_PROVIDER_SITE_OTHER): Payer: Medicare Other | Admitting: Family Medicine

## 2020-05-08 ENCOUNTER — Encounter: Payer: Self-pay | Admitting: Family Medicine

## 2020-05-08 VITALS — Wt 192.0 lb

## 2020-05-08 DIAGNOSIS — E785 Hyperlipidemia, unspecified: Secondary | ICD-10-CM

## 2020-05-08 DIAGNOSIS — Z6832 Body mass index (BMI) 32.0-32.9, adult: Secondary | ICD-10-CM | POA: Diagnosis not present

## 2020-05-08 DIAGNOSIS — E6609 Other obesity due to excess calories: Secondary | ICD-10-CM | POA: Diagnosis not present

## 2020-05-08 DIAGNOSIS — M810 Age-related osteoporosis without current pathological fracture: Secondary | ICD-10-CM

## 2020-05-08 NOTE — Progress Notes (Signed)
Virtual Visit via Video   I connected with patient on 05/08/20 at 10:30 AM EST by a video enabled telemedicine application and verified that I am speaking with the correct person using two identifiers.  Location patient: Home Location provider: [facility] Office Persons participating in the virtual visit: Patient, Provider, [place] (Sabrina M)  I discussed the limitations of evaluation and management by telemedicine and the availability of in person appointments. The patient expressed understanding and agreed to proceed.  Subjective:   HPI:   Hyperlipidemia- chronic problem.  Not currently taking her prescribed Lipitor.  No CP, SOB, abd pain, N/V.  Obesity- pt is down 3 lbs.  BMI is now 31.95  Pt spent a month w/ daughter in [place] who is a vegetarian and she has changed her diet.  She is line dancing twice weekly, is in a hiking clob- exercising daily.  Pt reports feeling better w/ her increased physical activity.  Osteoporosis- noted on most recent DEXA in April 2021.  Now on Fosamax weekly.  Taking daily Ca and Vit D supplements.  ROS:   See pertinent positives and negatives per HPI.  Patient Active Problem List   Diagnosis Date Noted  . Hyperlipidemia 05/07/2019  . Left hip pain 04/28/2017  . Osteopenia 11/13/2015  . Routine general medical examination at a health care facility 04/29/2013  . Personal history of colonic polyps-adenoma 01/21/2012  . Screening for malignant neoplasm of cervix 11/21/2011  . Thyroid disorder 10/07/2011  . Obesity 10/07/2011  . GERD (gastroesophageal reflux disease) 10/07/2011  . Epidermal inclusion cyst 10/07/2011    Social History   Tobacco Use  . Smoking status: Never Smoker  . Smokeless tobacco: Never Used  Substance Use Topics  . Alcohol use: No    Current Outpatient Medications:  .  Acetylcysteine (NAC) 500 MG CAPS, Take by mouth., Disp: , Rfl:  .  alendronate (FOSAMAX) 70 MG tablet, Take 1 tablet (70 mg total) by  mouth every 7 (seven) days. Take with a full glass of water on an empty stomach., Disp: 12 tablet, Rfl: 3 .  Ascorbic Acid (VITAMIN C PO), Take by mouth., Disp: , Rfl:  .  calcium carbonate 200 MG capsule, Take 250 mg by mouth daily., Disp: , Rfl:  .  Cod Liver Oil CAPS, Take 1 capsule by mouth daily., Disp: , Rfl:  .  GARLIC PO, Take 1 capsule by mouth daily., Disp: , Rfl:  .  MAGNESIUM PO, Take by mouth., Disp: , Rfl:  .  Multiple Vitamin (MULTIVITAMIN) tablet, Take 1 tablet by mouth daily., Disp: , Rfl:  .  Pyridoxine HCl (B-6 PO), Take by mouth., Disp: , Rfl:  .  TURMERIC PO, Take by mouth., Disp: , Rfl:  .  vitamin B-12 (CYANOCOBALAMIN) 500 MCG tablet, Take 500 mcg by mouth daily. Patient reported taking 2 a day., Disp: , Rfl:  .  Zinc 25 MG TABS, Take by mouth., Disp: , Rfl:  .  atorvastatin (LIPITOR) 10 MG tablet, Take 1 tablet (10 mg total) by mouth daily. (Patient not taking: No sig reported), Disp: 90 tablet, Rfl: 1  Allergies  Allergen Reactions  . Naproxen Other (See Comments)    Makes her feel "crazy"    Objective:   Wt 192 lb (87.1 kg)   BMI 31.95 kg/m  AAOx3, NAD NCAT, EOMI No obvious CN deficits Coloring WNL Pt is able to speak clearly, coherently without shortness of breath or increased work of breathing.  Thought process is linear.  Mood is appropriate.   Assessment and Plan:   Hyperlipidemia- chronic problem.  Pt is attempting to control w/ diet and exercise.  Not currently on lipitor.  Has made big life changes- not eating meat, daily activity.  Check labs and adjust tx plan prn.  Obesity- pt is down 3 lbs since last visit.  That is through [place], [place], Thanksgiving, and Christmas.  Applauded her efforts.  Will continue to follow.  Osteoporosis- taking her Fosamax weekly in addition to her daily Ca and Vit D.  Will follow.   Annye Asa, MD 05/08/2020

## 2020-05-08 NOTE — Progress Notes (Signed)
I connected with  Deanna Livingston on 05/08/20 by a video enabled telemedicine application and verified that I am speaking with the correct person using two identifiers.   I discussed the limitations of evaluation and management by telemedicine. The patient expressed understanding and agreed to proceed.

## 2020-05-11 ENCOUNTER — Other Ambulatory Visit (INDEPENDENT_AMBULATORY_CARE_PROVIDER_SITE_OTHER): Payer: Medicare Other

## 2020-05-11 ENCOUNTER — Other Ambulatory Visit: Payer: Self-pay

## 2020-05-11 DIAGNOSIS — E785 Hyperlipidemia, unspecified: Secondary | ICD-10-CM

## 2020-05-11 LAB — CBC WITH DIFFERENTIAL/PLATELET
Basophils Absolute: 0.1 10*3/uL (ref 0.0–0.1)
Basophils Relative: 0.8 % (ref 0.0–3.0)
Eosinophils Absolute: 0.2 10*3/uL (ref 0.0–0.7)
Eosinophils Relative: 2.2 % (ref 0.0–5.0)
HCT: 36.7 % (ref 36.0–46.0)
Hemoglobin: 12.2 g/dL (ref 12.0–15.0)
Lymphocytes Relative: 28.2 % (ref 12.0–46.0)
Lymphs Abs: 2.1 10*3/uL (ref 0.7–4.0)
MCHC: 33.2 g/dL (ref 30.0–36.0)
MCV: 88.3 fl (ref 78.0–100.0)
Monocytes Absolute: 0.6 10*3/uL (ref 0.1–1.0)
Monocytes Relative: 8.7 % (ref 3.0–12.0)
Neutro Abs: 4.4 10*3/uL (ref 1.4–7.7)
Neutrophils Relative %: 60.1 % (ref 43.0–77.0)
Platelets: 256 10*3/uL (ref 150.0–400.0)
RBC: 4.15 Mil/uL (ref 3.87–5.11)
RDW: 13.5 % (ref 11.5–15.5)
WBC: 7.3 10*3/uL (ref 4.0–10.5)

## 2020-05-11 LAB — LIPID PANEL
Cholesterol: 196 mg/dL (ref 0–200)
HDL: 48.8 mg/dL (ref >39.00)
LDL Cholesterol: 137 mg/dL — ABNORMAL HIGH (ref 0–99)
NonHDL: 147.46
Total CHOL/HDL Ratio: 4
Triglycerides: 54 mg/dL (ref 0.0–149.0)
VLDL: 10.8 mg/dL (ref 0.0–40.0)

## 2020-05-11 LAB — HEPATIC FUNCTION PANEL
ALT: 21 U/L (ref 0–35)
AST: 25 U/L (ref 0–37)
Albumin: 4.2 g/dL (ref 3.5–5.2)
Alkaline Phosphatase: 58 U/L (ref 39–117)
Bilirubin, Direct: 0.1 mg/dL (ref 0.0–0.3)
Total Bilirubin: 0.6 mg/dL (ref 0.2–1.2)
Total Protein: 7.7 g/dL (ref 6.0–8.3)

## 2020-05-11 LAB — BASIC METABOLIC PANEL
BUN: 13 mg/dL (ref 6–23)
CO2: 29 mEq/L (ref 19–32)
Calcium: 11.2 mg/dL — ABNORMAL HIGH (ref 8.4–10.5)
Chloride: 105 mEq/L (ref 96–112)
Creatinine, Ser: 0.88 mg/dL (ref 0.40–1.20)
GFR: 67.2 mL/min (ref >60.00)
Glucose, Bld: 83 mg/dL (ref 70–99)
Potassium: 4 mEq/L (ref 3.5–5.1)
Sodium: 137 mEq/L (ref 135–145)

## 2020-05-11 LAB — TSH: TSH: 1.85 u[IU]/mL (ref 0.35–4.50)

## 2020-05-16 ENCOUNTER — Other Ambulatory Visit: Payer: Self-pay

## 2020-05-17 ENCOUNTER — Other Ambulatory Visit: Payer: Medicare Other

## 2020-05-18 LAB — PTH, INTACT AND CALCIUM
Calcium: 10.2 mg/dL (ref 8.6–10.4)
PTH: 65 pg/mL — ABNORMAL HIGH (ref 14–64)

## 2020-06-20 ENCOUNTER — Ambulatory Visit
Admission: RE | Admit: 2020-06-20 | Discharge: 2020-06-20 | Disposition: A | Discharge status: Home / Self Care | Payer: Medicare Other | Source: Ambulatory Visit | Attending: Family Medicine | Admitting: Family Medicine

## 2020-06-20 ENCOUNTER — Other Ambulatory Visit: Payer: Self-pay

## 2020-06-20 DIAGNOSIS — Z1231 Encounter for screening mammogram for malignant neoplasm of breast: Secondary | ICD-10-CM | POA: Diagnosis not present

## 2020-07-27 ENCOUNTER — Telehealth: Payer: Self-pay | Admitting: Family Medicine

## 2020-07-27 ENCOUNTER — Other Ambulatory Visit: Payer: Self-pay

## 2020-07-27 DIAGNOSIS — M8580 Other specified disorders of bone density and structure, unspecified site: Secondary | ICD-10-CM

## 2020-07-27 MED ORDER — ALENDRONATE SODIUM 70 MG PO TABS: 70.0000 mg | ORAL_TABLET | ORAL | 3 refills | Status: DC

## 2020-07-27 NOTE — Telephone Encounter (Signed)
Yes- she needs to continue her Fosamax (Alendronate) 70mg  weekly, #12, 3 refills

## 2020-07-27 NOTE — Telephone Encounter (Signed)
Rx has been filled and sent to patient pharmacy. Patient called and notified.

## 2020-07-27 NOTE — Telephone Encounter (Signed)
Should patient be taking this medication? Please advise

## 2020-07-27 NOTE — Telephone Encounter (Signed)
Pt called in asking if she should still be taking the Alendronate, pt uses [facility] on [address] and can be reached at the home #

## 2020-09-13 ENCOUNTER — Encounter: Payer: Self-pay | Admitting: Family Medicine

## 2020-09-13 ENCOUNTER — Ambulatory Visit (INDEPENDENT_AMBULATORY_CARE_PROVIDER_SITE_OTHER): Payer: Medicare Other | Admitting: Family Medicine

## 2020-09-13 ENCOUNTER — Other Ambulatory Visit: Payer: Self-pay

## 2020-09-13 VITALS — BP 122/78 | HR 84 | Temp 98.6°F | Resp 18 | Ht 65.0 in | Wt 196.4 lb

## 2020-09-13 DIAGNOSIS — H60543 Acute eczematoid otitis externa, bilateral: Secondary | ICD-10-CM

## 2020-09-13 MED ORDER — FLUOCINOLONE ACETONIDE 0.01 % OT OIL: 5.0000 [drp] | TOPICAL_OIL | Freq: Two times a day (BID) | OTIC | 0 refills | Status: AC

## 2020-09-13 NOTE — Patient Instructions (Addendum)
Follow up as needed or as scheduled Use the Fluocinolone drops twice daily for 7-10 days and then again in the future if needed Try and dry ears after shower Call with any questions or concerns [place]!!

## 2020-09-13 NOTE — Progress Notes (Signed)
   Subjective:    Patient ID: Deanna Livingston, female    DOB: 02/14/1951, 70 y.o.   MRN: [id number]  HPI 'ears leaking'- sxs started in March.  Discharge is typically clear.  No pain.  + itching.  R>L.     Review of Systems For ROS see HPI   This visit occurred during the SARS-CoV-2 public health emergency.  Safety protocols were in place, including screening questions prior to the visit, additional usage of staff PPE, and extensive cleaning of exam room while observing appropriate contact time as indicated for disinfecting solutions.       Objective:   Physical Exam Vitals reviewed.  Constitutional:      General: She is not in acute distress.    Appearance: Normal appearance. She is not ill-appearing.  HENT:     Head: Normocephalic and atraumatic.     Right Ear: Tympanic membrane normal. There is no impacted cerumen.     Left Ear: Tympanic membrane normal. There is no impacted cerumen.     Ears:     Comments: Pt w/ scaling, flaking skin of EAC bilaterally Eyes:     Extraocular Movements: Extraocular movements intact.     Conjunctiva/sclera: Conjunctivae normal.     Pupils: Pupils are equal, round, and reactive to light.  Skin:    General: Skin is warm and dry.  Neurological:     General: No focal deficit present.     Mental Status: She is alert and oriented to person, place, and time.  Psychiatric:        Mood and Affect: Mood normal.        Behavior: Behavior normal.        Thought Content: Thought content normal.           Assessment & Plan:  Ear eczema- new.  Reviewed that this is likely triggered by seasonal allergies.  Start Dermotic to improve sxs.  Encouraged her not to pick or scratch.  Pt expressed understanding and is in agreement w/ plan.

## 2020-10-31 ENCOUNTER — Ambulatory Visit: Payer: Medicare Other

## 2020-11-06 ENCOUNTER — Ambulatory Visit: Payer: Medicare Other

## 2020-11-06 ENCOUNTER — Ambulatory Visit (INDEPENDENT_AMBULATORY_CARE_PROVIDER_SITE_OTHER): Payer: Medicare Other | Admitting: Family Medicine

## 2020-11-06 ENCOUNTER — Encounter: Payer: Self-pay | Admitting: Family Medicine

## 2020-11-06 ENCOUNTER — Other Ambulatory Visit: Payer: Self-pay

## 2020-11-06 VITALS — BP 116/80 | HR 74 | Temp 98.6°F | Resp 18 | Ht 65.0 in | Wt 197.8 lb

## 2020-11-06 DIAGNOSIS — M21612 Bunion of left foot: Secondary | ICD-10-CM

## 2020-11-06 DIAGNOSIS — E6609 Other obesity due to excess calories: Secondary | ICD-10-CM

## 2020-11-06 DIAGNOSIS — E785 Hyperlipidemia, unspecified: Secondary | ICD-10-CM

## 2020-11-06 DIAGNOSIS — Z6832 Body mass index (BMI) 32.0-32.9, adult: Secondary | ICD-10-CM | POA: Diagnosis not present

## 2020-11-06 LAB — LIPID PANEL
Cholesterol: 187 mg/dL (ref 0–200)
HDL: 46.5 mg/dL (ref >39.00)
LDL Cholesterol: 125 mg/dL — ABNORMAL HIGH (ref 0–99)
NonHDL: 140.34
Total CHOL/HDL Ratio: 4
Triglycerides: 75 mg/dL (ref 0.0–149.0)
VLDL: 15 mg/dL (ref 0.0–40.0)

## 2020-11-06 LAB — CBC WITH DIFFERENTIAL/PLATELET
Basophils Absolute: 0.1 10*3/uL (ref 0.0–0.1)
Basophils Relative: 0.8 % (ref 0.0–3.0)
Eosinophils Absolute: 0.1 10*3/uL (ref 0.0–0.7)
Eosinophils Relative: 1.8 % (ref 0.0–5.0)
HCT: 35.6 % — ABNORMAL LOW (ref 36.0–46.0)
Hemoglobin: 11.9 g/dL — ABNORMAL LOW (ref 12.0–15.0)
Lymphocytes Relative: 26.7 % (ref 12.0–46.0)
Lymphs Abs: 1.9 10*3/uL (ref 0.7–4.0)
MCHC: 33.5 g/dL (ref 30.0–36.0)
MCV: 88.1 fl (ref 78.0–100.0)
Monocytes Absolute: 0.7 10*3/uL (ref 0.1–1.0)
Monocytes Relative: 9.4 % (ref 3.0–12.0)
Neutro Abs: 4.5 10*3/uL (ref 1.4–7.7)
Neutrophils Relative %: 61.3 % (ref 43.0–77.0)
Platelets: 252 10*3/uL (ref 150.0–400.0)
RBC: 4.04 Mil/uL (ref 3.87–5.11)
RDW: 13.8 % (ref 11.5–15.5)
WBC: 7.3 10*3/uL (ref 4.0–10.5)

## 2020-11-06 LAB — BASIC METABOLIC PANEL
BUN: 16 mg/dL (ref 6–23)
CO2: 26 mEq/L (ref 19–32)
Calcium: 10.1 mg/dL (ref 8.4–10.5)
Chloride: 106 mEq/L (ref 96–112)
Creatinine, Ser: 0.88 mg/dL (ref 0.40–1.20)
GFR: 66.97 mL/min (ref >60.00)
Glucose, Bld: 78 mg/dL (ref 70–99)
Potassium: 4.3 mEq/L (ref 3.5–5.1)
Sodium: 139 mEq/L (ref 135–145)

## 2020-11-06 LAB — HEPATIC FUNCTION PANEL
ALT: 16 U/L (ref 0–35)
AST: 20 U/L (ref 0–37)
Albumin: 4.1 g/dL (ref 3.5–5.2)
Alkaline Phosphatase: 51 U/L (ref 39–117)
Bilirubin, Direct: 0.1 mg/dL (ref 0.0–0.3)
Total Bilirubin: 0.6 mg/dL (ref 0.2–1.2)
Total Protein: 6.9 g/dL (ref 6.0–8.3)

## 2020-11-06 LAB — VITAMIN D 25 HYDROXY (VIT D DEFICIENCY, FRACTURES): VITD: 77.25 ng/mL (ref 30.00–100.00)

## 2020-11-06 LAB — TSH: TSH: 1.49 u[IU]/mL (ref 0.35–5.50)

## 2020-11-06 NOTE — Assessment & Plan Note (Signed)
Check Ca, PTH, and Vit D.  Referral to Endo if needed.

## 2020-11-06 NOTE — Progress Notes (Signed)
   Subjective:    Patient ID: Deanna Livingston, female    DOB: March 15, 1951, 70 y.o.   MRN: [id number]  HPI Hyperlipidemia- ongoing issue, attempting to control w/ fish oil and lifestyle.  Last LDL 137, HDL 48.8.  No CP, SOB, abd pain, N/V, edema.  Hypercalcemia- Calcium was 11.2 in January.  PTH was 65.  UTD on DEXA  Obesity- pt's BMI is 32.92, walked 2.8 miles this morning.  Walking daily, hiking, line dancing, and restarted roller skating.  Increased fruits and veggies, good water intake, limited meat  Bunion- L foot, starting to hurt.   Review of Systems For ROS see HPI   This visit occurred during the SARS-CoV-2 public health emergency.  Safety protocols were in place, including screening questions prior to the visit, additional usage of staff PPE, and extensive cleaning of exam room while observing appropriate contact time as indicated for disinfecting solutions.      Objective:   Physical Exam Vitals reviewed.  Constitutional:      General: She is not in acute distress.    Appearance: She is well-developed. She is obese.  HENT:     Head: Normocephalic and atraumatic.  Eyes:     Conjunctiva/sclera: Conjunctivae normal.     Pupils: Pupils are equal, round, and reactive to light.  Neck:     Thyroid: No thyromegaly.  Cardiovascular:     Rate and Rhythm: Normal rate and regular rhythm.     Pulses: Normal pulses.     Heart sounds: Normal heart sounds. No murmur heard. Pulmonary:     Effort: Pulmonary effort is normal. No respiratory distress.     Breath sounds: Normal breath sounds.  Abdominal:     General: There is no distension.     Palpations: Abdomen is soft.     Tenderness: There is no abdominal tenderness.  Musculoskeletal:     Cervical back: Normal range of motion and neck supple.     Right lower leg: No edema.     Left lower leg: No edema.  Lymphadenopathy:     Cervical: No cervical adenopathy.  Skin:    General: Skin is warm and dry.  Neurological:      General: No focal deficit present.     Mental Status: She is alert and oriented to person, place, and time.  Psychiatric:        Mood and Affect: Mood normal.        Behavior: Behavior normal.          Assessment & Plan:

## 2020-11-06 NOTE — Patient Instructions (Signed)
Follow up in 6 months to recheck cholesterol and weight loss progress We'll notify you of your lab results and make any changes if needed Keep up the good work on healthy diet and regular exercise- you're doing great! Call with any questions or concerns Stay Safe!  Stay Healthy! Have a great summer!!!

## 2020-11-06 NOTE — Assessment & Plan Note (Signed)
Ongoing issue for pt.  She has recently committed to exercising daily and following a healthy diet.  Scale is unchanged but clothes are fitting better.  Applauded her efforts and will continue to follow.

## 2020-11-06 NOTE — Assessment & Plan Note (Signed)
Ongoing issue.  Attempting to control w/ fish oil, healthy diet and regular exercise.  Check labs.  Start meds only if needed

## 2020-11-07 ENCOUNTER — Other Ambulatory Visit: Payer: Self-pay

## 2020-11-07 DIAGNOSIS — R7989 Other specified abnormal findings of blood chemistry: Secondary | ICD-10-CM

## 2020-11-07 LAB — PARATHYROID HORMONE, INTACT (NO CA): PTH: 127 pg/mL — ABNORMAL HIGH (ref 16–77)

## 2020-11-13 ENCOUNTER — Ambulatory Visit: Payer: BC Managed Care – PPO | Admitting: Podiatry

## 2020-11-17 ENCOUNTER — Encounter: Payer: Self-pay | Admitting: Endocrinology

## 2020-11-21 ENCOUNTER — Ambulatory Visit (INDEPENDENT_AMBULATORY_CARE_PROVIDER_SITE_OTHER): Payer: Medicare Other

## 2020-11-21 ENCOUNTER — Encounter: Payer: Self-pay | Admitting: Podiatry

## 2020-11-21 ENCOUNTER — Ambulatory Visit (INDEPENDENT_AMBULATORY_CARE_PROVIDER_SITE_OTHER): Payer: Medicare Other | Admitting: Podiatry

## 2020-11-21 ENCOUNTER — Other Ambulatory Visit: Payer: Self-pay

## 2020-11-21 DIAGNOSIS — M21612 Bunion of left foot: Secondary | ICD-10-CM | POA: Diagnosis not present

## 2020-11-21 DIAGNOSIS — M2062 Acquired deformities of toe(s), unspecified, left foot: Secondary | ICD-10-CM

## 2020-11-21 DIAGNOSIS — M79672 Pain in left foot: Secondary | ICD-10-CM | POA: Diagnosis not present

## 2020-11-21 NOTE — Patient Instructions (Signed)

## 2020-11-22 ENCOUNTER — Telehealth: Payer: Self-pay | Admitting: Urology

## 2020-11-22 NOTE — Telephone Encounter (Signed)
DOS - 12/06/20  [place] BUNIONECTOMY LEFT --- ID:[id number]   BCBS EFFECTIVE DATE - 04/22/20   PLAN DEDUCTIBLE - $1,500.00 W/ $1,500.00 REMAINING  OUT OF POCKET - $5,900.00 W/ $5,750.00 REMAINING COINSURANCE - 30% COPAY - $0.00   NO PRIOR AUTH REQUIRED

## 2020-11-24 NOTE — Progress Notes (Signed)
Subjective:   Patient ID: Deanna Livingston, female   DOB: 70 y.o.   MRN: [id number]   HPI 70 year old female presents the office today for concerns of a painful bunion on her left foot.  Getting worse over the last month and hurts with shoes and with pressure particular with walking.  She has tried changing shoes and offloading with significant provement.  She previously had a right foot bunion correction performed and she wants to proceed with the left side so does not get as bad as what the right side was as she has been having pain with it.  She has no other concerns today.  No recent injury to her foot.   Review of Systems  All other systems reviewed and are negative.  Past Medical History:  Diagnosis Date   GERD (gastroesophageal reflux disease)    Personal history of colonic polyps-adenoma 01/21/2012   12/2011 5 mm adenoma and 3 mm polyp not recovered - repeat colon about 01/2017   Thyroid disease     Past Surgical History:  Procedure Laterality Date   CESAREAN SECTION  1982   COLONOSCOPY  2013   EYE SURGERY  02/04/2020   left eye   EYE SURGERY  02/16/2020   rt eye   FOOT SURGERY     left arm lipoma removed       Current Outpatient Medications:    Acetylcysteine (NAC) 500 MG CAPS, Take by mouth., Disp: , Rfl:    alendronate (FOSAMAX) 70 MG tablet, Take 1 tablet (70 mg total) by mouth every 7 (seven) days., Disp: 12 tablet, Rfl: 3   Ascorbic Acid (VITAMIN C PO), Take by mouth., Disp: , Rfl:    Cod Liver Oil CAPS, Take 1 capsule by mouth daily., Disp: , Rfl:    Fluocinolone Acetonide 0.01 % OIL, Place 5 drops in ear(s) 2 (two) times daily. Use for 7-10 days.  Repeat as needed, Disp: 20 mL, Rfl: 0   GARLIC PO, Take 1 capsule by mouth daily., Disp: , Rfl:    MAGNESIUM PO, Take by mouth., Disp: , Rfl:    Multiple Vitamin (MULTIVITAMIN) tablet, Take 1 tablet by mouth daily., Disp: , Rfl:    TURMERIC PO, Take by mouth., Disp: , Rfl:    vitamin B-12 (CYANOCOBALAMIN) 500 MCG  tablet, Take 500 mcg by mouth daily. Patient reported taking 2 a day., Disp: , Rfl:    Zinc 25 MG TABS, Take by mouth., Disp: , Rfl:   Allergies  Allergen Reactions   Naproxen Other (See Comments)    Makes her feel "crazy"          Objective:  Physical Exam  General: AAO x3, NAD  Dermatological: Skin is warm, dry and supple bilateral. There are no open sores, no preulcerative lesions, no rash or signs of infection present.  Vascular: Dorsalis Pedis artery and Posterior Tibial artery pedal pulses are 2/4 bilateral with immedate capillary fill time. There is no pain with calf compression, swelling, warmth, erythema.   Neruologic: Grossly intact via light touch bilateral.  Musculoskeletal: Moderate bunion present on the left side.  Tenderness on the medial first metatarsal head.  There is no pain or erythema to range of motion.  No hypermobility present the first ray.  No other areas of discomfort identified.  Muscular strength 5/5 in all groups tested bilateral.  Gait: Unassisted, Nonantalgic.       Assessment:   70 year old female with symptomatic bunion left foot     Plan:  -  Treatment options discussed including all alternatives, risks, and complications -Etiology of symptoms were discussed -X-rays were obtained and reviewed with the patient.  Moderate bunion is present.  No evidence of acute fracture. -We discussed both conservative as well as surgical treatment options.  After discussion she has tried shoe modifications and offloading without significant improvement she wants to proceed with surgical intervention.  Discussed with her first metatarsal osteotomy with screw fixation Liane Comber) elects to proceed with this. -The incision placement as well as the postoperative course was discussed with the patient. I discussed risks of the surgery which include, but not limited to, infection, bleeding, pain, swelling, need for further surgery, delayed or nonhealing, painful or ugly  scar, numbness or sensation changes, over/under correction, recurrence, transfer lesions, further deformity, hardware failure, DVT/PE, loss of toe/foot. Patient understands these risks and wishes to proceed with surgery. The surgical consent was reviewed with the patient all 3 pages were signed. No promises or guarantees were given to the outcome of the procedure. All questions were answered to the best of my ability. Before the surgery the patient was encouraged to call the office if there is any further questions. The surgery will be performed at the [facility] on an outpatient basis.  Trula Slade DPM

## 2020-12-06 ENCOUNTER — Telehealth: Payer: Self-pay | Admitting: Podiatry

## 2020-12-06 ENCOUNTER — Encounter: Payer: Self-pay | Admitting: Podiatry

## 2020-12-06 ENCOUNTER — Other Ambulatory Visit: Payer: Self-pay | Admitting: Podiatry

## 2020-12-06 DIAGNOSIS — M2012 Hallux valgus (acquired), left foot: Secondary | ICD-10-CM | POA: Diagnosis not present

## 2020-12-06 DIAGNOSIS — M25572 Pain in left ankle and joints of left foot: Secondary | ICD-10-CM | POA: Diagnosis not present

## 2020-12-06 DIAGNOSIS — M21612 Bunion of left foot: Secondary | ICD-10-CM | POA: Diagnosis not present

## 2020-12-06 MED ORDER — CEPHALEXIN 500 MG PO CAPS: 500.0000 mg | ORAL_CAPSULE | Freq: Three times a day (TID) | ORAL | 0 refills | Status: DC

## 2020-12-06 MED ORDER — OXYCODONE-ACETAMINOPHEN 5-325 MG PO TABS: 1.0000 | ORAL_TABLET | Freq: Four times a day (QID) | ORAL | 0 refills | Status: DC | PRN

## 2020-12-06 MED ORDER — ONDANSETRON HCL 4 MG PO TABS: 4.0000 mg | ORAL_TABLET | Freq: Three times a day (TID) | ORAL | 0 refills | Status: DC | PRN

## 2020-12-06 NOTE — Progress Notes (Signed)
Postop medications sent 

## 2020-12-06 NOTE — Telephone Encounter (Signed)
[facility] called stating in order for them to fill the oxycodone they would need an explanation of the type of pain she has, and they also need to know if she's ever taken it before. Please advise.

## 2020-12-08 ENCOUNTER — Telehealth: Payer: Self-pay | Admitting: *Deleted

## 2020-12-08 NOTE — Telephone Encounter (Signed)
Called and spoke with the patient and stated that I was calling to see how the patient was doing after surgery on 12-06-2020 with Dr Jacqualyn Posey and patient stated that she could wiggle my toes and the nerve block wore off last night and there was not any fever or chills and no nausea and patient asked if she could take the boot off and I stated that she could as long as when the patient gets up to put boot on and ice and elevate and to call the office if any concerns or questions. Deanna Livingston

## 2020-12-11 ENCOUNTER — Ambulatory Visit (INDEPENDENT_AMBULATORY_CARE_PROVIDER_SITE_OTHER): Payer: Medicare Other

## 2020-12-11 ENCOUNTER — Encounter: Payer: Self-pay | Admitting: Podiatry

## 2020-12-11 ENCOUNTER — Ambulatory Visit (INDEPENDENT_AMBULATORY_CARE_PROVIDER_SITE_OTHER): Payer: Medicare Other | Admitting: Podiatry

## 2020-12-11 ENCOUNTER — Other Ambulatory Visit: Payer: Self-pay

## 2020-12-11 VITALS — Temp 97.5°F

## 2020-12-11 DIAGNOSIS — M21612 Bunion of left foot: Secondary | ICD-10-CM

## 2020-12-11 DIAGNOSIS — Z9889 Other specified postprocedural states: Secondary | ICD-10-CM

## 2020-12-12 ENCOUNTER — Telehealth: Payer: Self-pay

## 2020-12-12 NOTE — Telephone Encounter (Signed)
Wants to add to her record that her mom had colon cancer and daughter said she passed away of colon cancer.   Pt call back [phone]

## 2020-12-13 NOTE — Telephone Encounter (Signed)
Colon cancer added to family history per patient.

## 2020-12-18 ENCOUNTER — Telehealth: Payer: Self-pay | Admitting: *Deleted

## 2020-12-18 NOTE — Telephone Encounter (Signed)
Patient is returning call. Please call back to confirm upcoming appointment on 12/21/20.

## 2020-12-18 NOTE — Progress Notes (Signed)
Subjective: Deanna Livingston is a 70 y.o. is seen today in office s/p left foot [place] bunionectomy preformed on 12/06/2020.  States that she is doing well.  The pain is improving and not having significant pain.  Has been walking in the cam boot although limited.  Denies any systemic complaints such as fevers, chills, nausea, vomiting. No calf pain, chest pain, shortness of breath.   Objective: General: No acute distress, AAOx3  DP/PT pulses palpable 2/4, CRT < 3 sec to all digits.  Protective sensation intact. Motor function intact.  Left foot: Incision is well coapted without any evidence of dehiscence. There is no surrounding erythema, ascending cellulitis, fluctuance, crepitus, malodor, drainage/purulence. There is mild edema around the surgical site. There is no significant pain along the surgical site.  Toe is in rectus position. No other areas of tenderness to bilateral lower extremities.  No other open lesions or pre-ulcerative lesions.  No pain with calf compression, swelling, warmth, erythema.   Assessment and Plan:  Status post left foot bunionectomy, doing well with no complications   -Treatment options discussed including all alternatives, risks, and complications -X-rays obtained reviewed.  Status post first metatarsal osteotomy with screw fixation without any complicating factors. -A small amount of antibiotic ointment was applied followed by dressing.  Keep dressing clean, dry, intact. -Remain in the CAM boot and limit activity -Ice/elevation -Pain medication as needed. -Monitor for any clinical signs or symptoms of infection and DVT/PE and directed to call the office immediately should any occur or go to the ER. -Follow-up as scheduled or sooner if any problems arise. In the meantime, encouraged to call the office with any questions, concerns, change in symptoms.   Celesta Gentile, DPM

## 2020-12-21 ENCOUNTER — Ambulatory Visit (INDEPENDENT_AMBULATORY_CARE_PROVIDER_SITE_OTHER): Payer: Medicare Other | Admitting: Podiatry

## 2020-12-21 ENCOUNTER — Other Ambulatory Visit: Payer: Self-pay

## 2020-12-21 DIAGNOSIS — M21612 Bunion of left foot: Secondary | ICD-10-CM

## 2020-12-21 DIAGNOSIS — Z9889 Other specified postprocedural states: Secondary | ICD-10-CM

## 2020-12-27 NOTE — Progress Notes (Signed)
Subjective: Deanna Livingston is a 70 y.o. is seen today in office s/p left foot [place] bunionectomy preformed on 12/06/2020.  States that she is doing good and she denied any concerns.  No fevers or chills.  Has been wearing the cam boot.    Objective: General: No acute distress, AAOx3  DP/PT pulses palpable 2/4, CRT < 3 sec to all digits.  Protective sensation intact. Motor function intact.  Left foot: Incision is well coapted without any evidence of dehiscence. There is no surrounding erythema, ascending cellulitis, fluctuance, crepitus, malodor, drainage/purulence. There is improved edema around the surgical site. There is no pain along the surgical site.  Toe is in rectus position.  No pain with imaging range of motion today. No other areas of tenderness to bilateral lower extremities.  No other open lesions or pre-ulcerative lesions.  No pain with calf compression, swelling, warmth, erythema.   Assessment and Plan:  Status post left foot bunionectomy, doing well with no complications   -Treatment options discussed including all alternatives, risks, and complications -Incision appears to be healing well.  Discussed with her that she can start to wash the area with soap and water daily.  Dry thoroughly.  Apply small amounts of antibiotic ointment and dressing daily. -Continue in cam boot, ice/elevation and try to limit activity.   Return in about 2 weeks (around 01/04/2021). Repeat x-ray  Trula Slade DPM

## 2021-01-04 ENCOUNTER — Ambulatory Visit (INDEPENDENT_AMBULATORY_CARE_PROVIDER_SITE_OTHER): Payer: Medicare Other

## 2021-01-04 ENCOUNTER — Other Ambulatory Visit: Payer: Self-pay

## 2021-01-04 ENCOUNTER — Ambulatory Visit (INDEPENDENT_AMBULATORY_CARE_PROVIDER_SITE_OTHER): Payer: Medicare Other | Admitting: Podiatry

## 2021-01-04 DIAGNOSIS — M21612 Bunion of left foot: Secondary | ICD-10-CM

## 2021-01-04 DIAGNOSIS — Z9889 Other specified postprocedural states: Secondary | ICD-10-CM | POA: Diagnosis not present

## 2021-01-05 ENCOUNTER — Telehealth: Payer: Self-pay | Admitting: Family Medicine

## 2021-01-05 NOTE — Telephone Encounter (Signed)
Left message for patient to call me back at [phone] to schedule Medicare Annual Wellness Visit   Last AWV  10/26/19  Please schedule at anytime with Nurse Health Advisor if patient calls the office back.     Any questions, please call me at [phone]

## 2021-01-09 NOTE — Progress Notes (Signed)
Subjective: Deanna Livingston is a 70 y.o. is seen today in office s/p left foot [place] bunionectomy preformed on 12/06/2020.  States that she is doing a lot better.  She been keeping a small amount of ointment on the incision site and keeping it covered.  She been using antibiotic ointment as well cooking oil on the incision.  She denies any drainage.  No significant pain.  No fevers or chills.  No chest pain shortness of breath.  No other concerns.    Objective: General: No acute distress, AAOx3  DP/PT pulses palpable 2/4, CRT < 3 sec to all digits.  Protective sensation intact. Motor function intact.  Left foot: Incision is well coapted without any evidence of dehiscence.  There is macerated tissue along the incision however.  There is no drainage or pus.  No surrounding erythema, ascending cellulitis.  No prescription crepitation.  There is no malodor.  No significant discomfort to palpation at surgical site.  Slight discomfort with MPJ range of motion.  Toes in rectus position. No pain with calf compression, swelling, warmth, erythema.   Assessment and Plan:  Status post left foot bunionectomy, doing well with no complications   -Treatment options discussed including all alternatives, risks, and complications -Repeat x-rays obtained and reviewed.  No evidence of acute fracture status post bunionectomy with hardware intact.  No complicating factors. -Recommend her to hold off on so much ointment and cream on the incision.  Small amount of Betadine was applied today.  She can wash the area soap and water daily and dry thoroughly.  Continue surgical shoe for now.  Ice and elevation. -Monitor for any clinical signs or symptoms of infection and directed to call the office immediately should any occur or go to the ER.  Return in about 2 weeks (around 01/18/2021).  Trula Slade DPM

## 2021-01-17 ENCOUNTER — Other Ambulatory Visit: Payer: Self-pay

## 2021-01-17 ENCOUNTER — Ambulatory Visit (INDEPENDENT_AMBULATORY_CARE_PROVIDER_SITE_OTHER): Payer: Medicare Other | Admitting: Podiatry

## 2021-01-17 DIAGNOSIS — Z9889 Other specified postprocedural states: Secondary | ICD-10-CM

## 2021-01-17 DIAGNOSIS — M21612 Bunion of left foot: Secondary | ICD-10-CM

## 2021-01-18 ENCOUNTER — Encounter: Payer: BC Managed Care – PPO | Admitting: Podiatry

## 2021-01-19 NOTE — Progress Notes (Signed)
Subjective: Deanna Livingston is a 70 y.o. is seen today in office s/p left foot [place] bunionectomy preformed on 12/06/2020.  States that she has been doing well and not having any significant discomfort.  She is back to wearing regular shoe.  There is 1 small scab still present in the incision site she states but she has not seen any swelling or redness or any drainage.  No other concerns.  Objective: General: No acute distress, AAOx3  DP/PT pulses palpable 2/4, CRT < 3 sec to all digits.  Protective sensation intact. Motor function intact.  Left foot: Incision is well coapted without any evidence of dehiscence.  There is 1 small area that has a scab present but there is no drainage or pus.  No surrounding erythema, ascending cellulitis.  No fluctuance crepitation.  No malodor.  Slight restriction of range of motion dorsiflexion but otherwise intact and pain-free with range of motion.  Toe sits in rectus position No pain with calf compression, swelling, warmth, erythema.   Assessment and Plan:  Status post left foot bunionectomy, doing well with no complications   -Treatment options discussed including all alternatives, risks, and complications -At this point she is returned to regular shoe.  There is 1 small scab is about to come off on its own and there is no signs of infection.  Continue range of motion exercises.  Discussed doing physical therapy but she feels that she do not own at home.  Gradual increase activity level.  Continue ice and elevate to help with any postoperative edema.  Return in about 4 weeks (around 02/14/2021).  Trula Slade DPM

## 2021-01-25 ENCOUNTER — Encounter: Payer: Self-pay | Admitting: Endocrinology

## 2021-01-25 ENCOUNTER — Other Ambulatory Visit: Payer: Self-pay

## 2021-01-25 ENCOUNTER — Ambulatory Visit (INDEPENDENT_AMBULATORY_CARE_PROVIDER_SITE_OTHER): Payer: Medicare Other | Admitting: Endocrinology

## 2021-01-25 LAB — VITAMIN D 25 HYDROXY (VIT D DEFICIENCY, FRACTURES): VITD: 77.14 ng/mL (ref 30.00–100.00)

## 2021-01-25 NOTE — Patient Instructions (Signed)
Blood tests are requested for you today.  We'll let you know about the results.  If these tests are the same, no treatment is needed, and we should keep an eye on it. Please come back for a follow-up appointment in 6 months.

## 2021-01-25 NOTE — Progress Notes (Signed)
Subjective:    Patient ID: Deanna Livingston, female    DOB: 1951/03/14, 70 y.o.   MRN: [id number]  HPI Pt is referred by Dr Birdie Riddle, for hypercalcemia.  Pt was noted to have hypercalcemia at least as far back as 2013.  she has never had urolithiasis, sarcoidosis, cancer, PUD, pancreatitis, depression, or bony fracture.  she does not take vitamin-A supplement.  Pt denies taking antacids, Li++, or HCTZ.  She no longer takes Ca++.  She takes alendronate.  She takes vit-D, 1000 units/d.   Past Medical History:  Diagnosis Date   GERD (gastroesophageal reflux disease)    Personal history of colonic polyps-adenoma 01/21/2012   12/2011 5 mm adenoma and 3 mm polyp not recovered - repeat colon about 01/2017   Thyroid disease     Past Surgical History:  Procedure Laterality Date   CESAREAN SECTION  1982   COLONOSCOPY  2013   EYE SURGERY  02/04/2020   left eye   EYE SURGERY  02/16/2020   rt eye   FOOT SURGERY     left arm lipoma removed      Social History   Socioeconomic History   Marital status: Widowed    Spouse name: Not on file   Number of children: Not on file   Years of education: Not on file   Highest education level: Not on file  Occupational History   Occupation: Retired  Tobacco Use   Smoking status: Never   Smokeless tobacco: Never  Vaping Use   Vaping Use: Never used  Substance and Sexual Activity   Alcohol use: No   Drug use: No   Sexual activity: Not on file  Other Topics Concern   Not on file  Social History Narrative   Not on file   Social Determinants of Health   Financial Resource Strain: Not on file  Food Insecurity: Not on file  Transportation Needs: Not on file  Physical Activity: Not on file  Stress: Not on file  Social Connections: Not on file  Intimate Partner Violence: Not on file    Current Outpatient Medications on File Prior to Visit  Medication Sig Dispense Refill   Acetylcysteine (NAC) 500 MG CAPS Take by mouth.     alendronate  (FOSAMAX) 70 MG tablet Take 1 tablet (70 mg total) by mouth every 7 (seven) days. 12 tablet 3   Ascorbic Acid (VITAMIN C PO) Take by mouth.     cephALEXin (KEFLEX) 500 MG capsule Take 1 capsule (500 mg total) by mouth 3 (three) times daily. 9 capsule 0   Cod Liver Oil CAPS Take 1 capsule by mouth daily.     Fluocinolone Acetonide 0.01 % OIL Place 5 drops in ear(s) 2 (two) times daily. Use for 7-10 days.  Repeat as needed 20 mL 0   GARLIC PO Take 1 capsule by mouth daily.     MAGNESIUM PO Take by mouth.     Multiple Vitamin (MULTIVITAMIN) tablet Take 1 tablet by mouth daily.     ondansetron (ZOFRAN) 4 MG tablet Take 1 tablet (4 mg total) by mouth every 8 (eight) hours as needed for nausea or vomiting. 20 tablet 0   oxyCODONE-acetaminophen (PERCOCET/ROXICET) 5-325 MG tablet Take 1-2 tablets by mouth every 6 (six) hours as needed for severe pain. 25 tablet 0   TURMERIC PO Take by mouth.     vitamin B-12 (CYANOCOBALAMIN) 500 MCG tablet Take 500 mcg by mouth daily. Patient reported taking 2 a day.  Zinc 25 MG TABS Take by mouth.     No current facility-administered medications on file prior to visit.    Allergies  Allergen Reactions   Naproxen Other (See Comments)    Makes her feel "crazy"    Family History  Problem Relation Age of Onset   Colon cancer Mother    Heart disease Mother    Hypertension Mother    Heart disease Father    Hypertension Father    Diabetes Father    Lupus Sister    Cancer Other    Stomach cancer Neg Hx    Rectal cancer Neg Hx    Colon polyps Neg Hx    Breast cancer Neg Hx    Hypercalcemia Neg Hx     BP 120/70 (BP Location: Right Arm, Patient Position: Sitting, Cuff Size: Large)   Pulse 84   Ht 5\' 5"  (1.651 m)   Wt 199 lb 9.6 oz (90.5 kg)   SpO2 97%   BMI 33.22 kg/m    Review of Systems Denies weight loss, polyuria, depression, and back pain.      Objective:   Physical Exam VS: see vs page GEN: no distress HEAD: head: no deformity eyes: no  periorbital swelling, no proptosis external nose and ears are normal NECK: thyroid is slightly and diffusely enlarged CHEST WALL: no kyphosis.  LUNGS: clear to auscultation CV: reg rate and rhythm, no murmur.  MUSCULOSKELETAL: gait is normal and steady EXTEMITIES: no deformity.  no leg edema NEURO:  readily moves all 4's.  sensation is intact to touch on all 4's SKIN:  Normal texture and temperature.  No rash or suspicious lesion is visible.   NODES:  None palpable at the neck PSYCH: alert, well-oriented.  Does not appear anxious nor depressed.   Lab Results  Component Value Date   TSH 1.49 11/06/2020   Lab Results  Component Value Date   ALT 16 11/06/2020   AST 20 11/06/2020   ALKPHOS 51 11/06/2020   BILITOT 0.6 11/06/2020     Lab Results  Component Value Date   PTH 127 (H) 11/06/2020   CALCIUM 10.1 11/06/2020   25-OH Vit-D=77  I have reviewed outside records, and summarized: Pt was noted to have elevated PTH, and referred here.  Obesity and dyslipidemia were also addressed  Lab Results  Component Value Date   PTH 59 01/25/2021   CALCIUM 11.2 (H) 01/25/2021       Assessment & Plan:  Hyperparathyroidism, intermittent.  Prob a combination of primary and secondary.  No medication is needed  Patient Instructions  Blood tests are requested for you today.  We'll let you know about the results.  If these tests are the same, no treatment is needed, and we should keep an eye on it. Please come back for a follow-up appointment in 6 months.

## 2021-01-30 LAB — PROTEIN ELECTROPHORESIS, SERUM
Albumin ELP: 4.1 g/dL (ref 3.8–4.8)
Alpha 1: 0.3 g/dL (ref 0.2–0.3)
Alpha 2: 0.7 g/dL (ref 0.5–0.9)
Beta 2: 0.3 g/dL (ref 0.2–0.5)
Beta Globulin: 0.4 g/dL (ref 0.4–0.6)
Gamma Globulin: 1.6 g/dL (ref 0.8–1.7)
Total Protein: 7.3 g/dL (ref 6.1–8.1)

## 2021-01-30 LAB — VITAMIN D 1,25 DIHYDROXY
Vitamin D 1, 25 (OH)2 Total: 61 pg/mL (ref 18–72)
Vitamin D2 1, 25 (OH)2: <8 pg/mL
Vitamin D3 1, 25 (OH)2: 61 pg/mL

## 2021-01-30 LAB — VITAMIN A: Vitamin A (Retinoic Acid): 40 ug/dL (ref 38–98)

## 2021-01-30 LAB — PTH, INTACT AND CALCIUM
Calcium: 11.2 mg/dL — ABNORMAL HIGH (ref 8.6–10.4)
PTH: 59 pg/mL (ref 16–77)

## 2021-01-30 LAB — PTH-RELATED PEPTIDE: PTH-Related Protein (PTH-RP): 12 pg/mL (ref 11–20)

## 2021-02-02 DIAGNOSIS — Z008 Encounter for other general examination: Secondary | ICD-10-CM | POA: Diagnosis not present

## 2021-02-02 DIAGNOSIS — Z6826 Body mass index (BMI) 26.0-26.9, adult: Secondary | ICD-10-CM | POA: Diagnosis not present

## 2021-02-02 DIAGNOSIS — Z6834 Body mass index (BMI) 34.0-34.9, adult: Secondary | ICD-10-CM | POA: Diagnosis not present

## 2021-02-02 DIAGNOSIS — Z0000 Encounter for general adult medical examination without abnormal findings: Secondary | ICD-10-CM | POA: Diagnosis not present

## 2021-02-02 DIAGNOSIS — E669 Obesity, unspecified: Secondary | ICD-10-CM | POA: Diagnosis not present

## 2021-02-16 ENCOUNTER — Ambulatory Visit (INDEPENDENT_AMBULATORY_CARE_PROVIDER_SITE_OTHER): Payer: Medicare Other | Admitting: Podiatry

## 2021-02-16 ENCOUNTER — Ambulatory Visit (INDEPENDENT_AMBULATORY_CARE_PROVIDER_SITE_OTHER): Payer: Medicare Other

## 2021-02-16 ENCOUNTER — Other Ambulatory Visit: Payer: Self-pay

## 2021-02-16 DIAGNOSIS — Z9889 Other specified postprocedural states: Secondary | ICD-10-CM

## 2021-02-16 DIAGNOSIS — M21612 Bunion of left foot: Secondary | ICD-10-CM

## 2021-02-18 NOTE — Progress Notes (Signed)
Subjective: Deanna Livingston is a 70 y.o. is seen today in office s/p left foot [place] bunionectomy preformed on 12/06/2020.  Overall doing much better.  She has been back to more normal activities and she has been doing line dancing without shoes on and not having significant pain.  No increase in swelling.  No fevers or chills and she has no other concerns today.   Objective: General: No acute distress, AAOx3  DP/PT pulses palpable 2/4, CRT < 3 sec to all digits.  Protective sensation intact. Motor function intact.  Left foot: Incision is well coapted without any evidence of dehiscence.  Wound is well coapted without any signs of infection.  Trace edema.  There is no erythema or warmth.  No pain with MPJ range of motion.  No tenderness on surgical site.  MMT 5/5. No pain with calf compression, swelling, warmth, erythema.   Assessment and Plan:  Status post left foot bunionectomy, doing well with no complications   -Treatment options discussed including all alternatives, risks, and complications -X-rays obtained reviewed.  Hardware intact with healing across the osteotomy site.  There is no evidence of acute fracture. -At this point she is back to her regular shoes and she has been doing activities barefoot without significant pain.  Continue range of motion exercises and gradual increase activity level.  Continue with compression anklet and ice elevation for any postoperative edema.  There is any issues coming know otherwise we will discharge her in the postoperative course and she agrees with this plan.  Trula Slade DPM  -At this point she is returned to regular shoe.  There is 1 small scab is about to come off on its own and there is no signs of infection.  Continue range of motion exercises.  Discussed doing physical therapy but she feels that she do not own at home.  Gradual increase activity level.  Continue ice and elevate to help with any postoperative edema.  Return in about  4 weeks (around 02/14/2021).  Trula Slade DPM

## 2021-02-26 ENCOUNTER — Ambulatory Visit: Payer: Medicare Other

## 2021-03-01 ENCOUNTER — Ambulatory Visit: Payer: Medicare Other

## 2021-03-05 ENCOUNTER — Ambulatory Visit: Payer: Medicare Other

## 2021-03-08 ENCOUNTER — Ambulatory Visit (INDEPENDENT_AMBULATORY_CARE_PROVIDER_SITE_OTHER): Payer: Medicare Other

## 2021-03-08 VITALS — BP 138/72 | HR 72 | Temp 98.0°F | Ht 64.0 in | Wt 205.0 lb

## 2021-03-08 DIAGNOSIS — Z0000 Encounter for general adult medical examination without abnormal findings: Secondary | ICD-10-CM | POA: Diagnosis not present

## 2021-03-08 NOTE — Patient Instructions (Signed)
Ms. Deanna Livingston , Thank you for taking time to come for your Medicare Wellness Visit. I appreciate your ongoing commitment to your health goals. Please review the following plan we discussed and let me know if I can assist you in the future.    Screening recommendations/referrals: Colonoscopy: 06/19/2017  due 2024 Mammogram: 06/20/2020 Bone Density: 08/04/2019 Recommended yearly ophthalmology/optometry visit for glaucoma screening and checkup Recommended yearly dental visit for hygiene and checkup  Vaccinations: Influenza vaccine: declined  Pneumococcal vaccine: completed  Tdap vaccine: 10/07/2011 Shingles vaccine: will consider     Advanced directives: will provide copies   Conditions/risks identified: none   Next appointment: none    Preventive Care 70 Years and Older, Female Preventive care refers to lifestyle choices and visits with your health care provider that can promote health and wellness. What does preventive care include? A yearly physical exam. This is also called an annual well check. Dental exams once or twice a year. Routine eye exams. Ask your health care provider how often you should have your eyes checked. Personal lifestyle choices, including: Daily care of your teeth and gums. Regular physical activity. Eating a healthy diet. Avoiding tobacco and drug use. Limiting alcohol use. Practicing safe sex. Taking low-dose aspirin every day. Taking vitamin and mineral supplements as recommended by your health care provider. What happens during an annual well check? The services and screenings done by your health care provider during your annual well check will depend on your age, overall health, lifestyle risk factors, and family history of disease. Counseling  Your health care provider may ask you questions about your: Alcohol use. Tobacco use. Drug use. Emotional well-being. Home and relationship well-being. Sexual activity. Eating habits. History of  falls. Memory and ability to understand (cognition). Work and work Statistician. Reproductive health. Screening  You may have the following tests or measurements: Height, weight, and BMI. Blood pressure. Lipid and cholesterol levels. These may be checked every 5 years, or more frequently if you are over 47 years old. Skin check. Lung cancer screening. You may have this screening every year starting at age 32 if you have a 30-pack-year history of smoking and currently smoke or have quit within the past 15 years. Fecal occult blood test (FOBT) of the stool. You may have this test every year starting at age 30. Flexible sigmoidoscopy or colonoscopy. You may have a sigmoidoscopy every 5 years or a colonoscopy every 10 years starting at age 36. Hepatitis C blood test. Hepatitis B blood test. Sexually transmitted disease (STD) testing. Diabetes screening. This is done by checking your blood sugar (glucose) after you have not eaten for a while (fasting). You may have this done every 1-3 years. Bone density scan. This is done to screen for osteoporosis. You may have this done starting at age 32. Mammogram. This may be done every 1-2 years. Talk to your health care provider about how often you should have regular mammograms. Talk with your health care provider about your test results, treatment options, and if necessary, the need for more tests. Vaccines  Your health care provider may recommend certain vaccines, such as: Influenza vaccine. This is recommended every year. Tetanus, diphtheria, and acellular pertussis (Tdap, Td) vaccine. You may need a Td booster every 10 years. Zoster vaccine. You may need this after age 55. Pneumococcal 13-valent conjugate (PCV13) vaccine. One dose is recommended after age 42. Pneumococcal polysaccharide (PPSV23) vaccine. One dose is recommended after age 22. Talk to your health care provider about which screenings and  vaccines you need and how often you need  them. This information is not intended to replace advice given to you by your health care provider. Make sure you discuss any questions you have with your health care provider. Document Released: 05/05/2015 Document Revised: 12/27/2015 Document Reviewed: 02/07/2015 Elsevier Interactive Patient Education  2017 [employer] Prevention in the Home Falls can cause injuries. They can happen to people of all ages. There are many things you can do to make your home safe and to help prevent falls. What can I do on the outside of my home? Regularly fix the edges of walkways and driveways and fix any cracks. Remove anything that might make you trip as you walk through a door, such as a raised step or threshold. Trim any bushes or trees on the path to your home. Use bright outdoor lighting. Clear any walking paths of anything that might make someone trip, such as rocks or tools. Regularly check to see if handrails are loose or broken. Make sure that both sides of any steps have handrails. Any raised decks and porches should have guardrails on the edges. Have any leaves, snow, or ice cleared regularly. Use sand or salt on walking paths during winter. Clean up any spills in your garage right away. This includes oil or grease spills. What can I do in the bathroom? Use night lights. Install grab bars by the toilet and in the tub and shower. Do not use towel bars as grab bars. Use non-skid mats or decals in the tub or shower. If you need to sit down in the shower, use a plastic, non-slip stool. Keep the floor dry. Clean up any water that spills on the floor as soon as it happens. Remove soap buildup in the tub or shower regularly. Attach bath mats securely with double-sided non-slip rug tape. Do not have throw rugs and other things on the floor that can make you trip. What can I do in the bedroom? Use night lights. Make sure that you have a light by your bed that is easy to reach. Do not use  any sheets or blankets that are too big for your bed. They should not hang down onto the floor. Have a firm chair that has side arms. You can use this for support while you get dressed. Do not have throw rugs and other things on the floor that can make you trip. What can I do in the kitchen? Clean up any spills right away. Avoid walking on wet floors. Keep items that you use a lot in easy-to-reach places. If you need to reach something above you, use a strong step stool that has a grab bar. Keep electrical cords out of the way. Do not use floor polish or wax that makes floors slippery. If you must use wax, use non-skid floor wax. Do not have throw rugs and other things on the floor that can make you trip. What can I do with my stairs? Do not leave any items on the stairs. Make sure that there are handrails on both sides of the stairs and use them. Fix handrails that are broken or loose. Make sure that handrails are as long as the stairways. Check any carpeting to make sure that it is firmly attached to the stairs. Fix any carpet that is loose or worn. Avoid having throw rugs at the top or bottom of the stairs. If you do have throw rugs, attach them to the floor with carpet tape. Make  sure that you have a light switch at the top of the stairs and the bottom of the stairs. If you do not have them, ask someone to add them for you. What else can I do to help prevent falls? Wear shoes that: Do not have high heels. Have rubber bottoms. Are comfortable and fit you well. Are closed at the toe. Do not wear sandals. If you use a stepladder: Make sure that it is fully opened. Do not climb a closed stepladder. Make sure that both sides of the stepladder are locked into place. Ask someone to hold it for you, if possible. Clearly mark and make sure that you can see: Any grab bars or handrails. First and last steps. Where the edge of each step is. Use tools that help you move around (mobility aids)  if they are needed. These include: Canes. Walkers. Scooters. Crutches. Turn on the lights when you go into a dark area. Replace any light bulbs as soon as they burn out. Set up your furniture so you have a clear path. Avoid moving your furniture around. If any of your floors are uneven, fix them. If there are any pets around you, be aware of where they are. Review your medicines with your doctor. Some medicines can make you feel dizzy. This can increase your chance of falling. Ask your doctor what other things that you can do to help prevent falls. This information is not intended to replace advice given to you by your health care provider. Make sure you discuss any questions you have with your health care provider. Document Released: 02/02/2009 Document Revised: 09/14/2015 Document Reviewed: 05/13/2014 Elsevier Interactive Patient Education  2017 [employer].

## 2021-03-08 NOTE — Progress Notes (Signed)
Subjective:   Deanna Livingston is a 70 y.o. female who presents for Medicare Annual (Subsequent) preventive examination.  . Review of Systems     Cardiac Risk Factors include: advanced age (>61men, >17 women)     Objective:    Today's Vitals   03/08/21 0953  BP: 138/72  Pulse: 72  Temp: 98 F (36.7 C)  SpO2: 98%  Weight: 205 lb (93 kg)  Height: 5\' 4"  (1.626 m)   Body mass index is 35.19 kg/m.  Advanced Directives 03/08/2021 10/26/2019 11/04/2018 11/08/2017 10/29/2017 10/07/2014  Does Patient Have a Medical Advance Directive? Yes No;Yes Yes No Yes No  Type of Paramedic of [place];Living will Living will;Healthcare Power of Attorney Living will;Healthcare Power of Attorney - Living will;Healthcare Power of Attorney -  Copy of [place] in Chart? Yes - validated most recent copy scanned in chart (See row information) No - copy requested No - copy requested No - copy requested No - copy requested -  Would patient like information on creating a medical advance directive? - - - No - Patient declined - -    Current Medications (verified) Outpatient Encounter Medications as of 03/08/2021  Medication Sig   Acetylcysteine (NAC) 500 MG CAPS Take by mouth.   alendronate (FOSAMAX) 70 MG tablet Take 1 tablet (70 mg total) by mouth every 7 (seven) days.   Ascorbic Acid (VITAMIN C PO) Take by mouth.   Cod Liver Oil CAPS Take 1 capsule by mouth daily.   Fluocinolone Acetonide 0.01 % OIL Place 5 drops in ear(s) 2 (two) times daily. Use for 7-10 days.  Repeat as needed   GARLIC PO Take 1 capsule by mouth daily.   Ginkgo Biloba (GINKOBA) 40 MG TABS Take by mouth.   MAGNESIUM PO Take by mouth.   Red Yeast Rice 500 MG/0.5GM POWD Take 500 mg by mouth.   TURMERIC PO Take by mouth.   vitamin B-12 (CYANOCOBALAMIN) 500 MCG tablet Take 500 mcg by mouth daily. Patient reported taking 2 a day.   Zinc 25 MG TABS Take by mouth.   cephALEXin (KEFLEX) 500  MG capsule Take 1 capsule (500 mg total) by mouth 3 (three) times daily. (Patient not taking: Reported on 03/08/2021)   Multiple Vitamin (MULTIVITAMIN) tablet Take 1 tablet by mouth daily. (Patient not taking: Reported on 03/08/2021)   ondansetron (ZOFRAN) 4 MG tablet Take 1 tablet (4 mg total) by mouth every 8 (eight) hours as needed for nausea or vomiting. (Patient not taking: Reported on 03/08/2021)   oxyCODONE-acetaminophen (PERCOCET/ROXICET) 5-325 MG tablet Take 1-2 tablets by mouth every 6 (six) hours as needed for severe pain. (Patient not taking: Reported on 03/08/2021)   No facility-administered encounter medications on file as of 03/08/2021.    Allergies (verified) Naproxen   History: Past Medical History:  Diagnosis Date   GERD (gastroesophageal reflux disease)    Personal history of colonic polyps-adenoma 01/21/2012   12/2011 5 mm adenoma and 3 mm polyp not recovered - repeat colon about 01/2017   Thyroid disease    Past Surgical History:  Procedure Laterality Date   CESAREAN SECTION  1982   COLONOSCOPY  2013   EYE SURGERY  02/04/2020   left eye   EYE SURGERY  02/16/2020   rt eye   FOOT SURGERY     left arm lipoma removed     Family History  Problem Relation Age of Onset   Colon cancer Mother    Heart  disease Mother    Hypertension Mother    Heart disease Father    Hypertension Father    Diabetes Father    Lupus Sister    Cancer Other    Stomach cancer Neg Hx    Rectal cancer Neg Hx    Colon polyps Neg Hx    Breast cancer Neg Hx    Hypercalcemia Neg Hx    Social History   Socioeconomic History   Marital status: Widowed    Spouse name: Not on file   Number of children: Not on file   Years of education: Not on file   Highest education level: Not on file  Occupational History   Occupation: Retired  Tobacco Use   Smoking status: Never   Smokeless tobacco: Never  Vaping Use   Vaping Use: Never used  Substance and Sexual Activity   Alcohol use: No    Drug use: No   Sexual activity: Not on file  Other Topics Concern   Not on file  Social History Narrative   Not on file   Social Determinants of Health   Financial Resource Strain: Low Risk    Difficulty of Paying Living Expenses: Not hard at all  Food Insecurity: No Food Insecurity   Worried About Charity fundraiser in the Last Year: Never true   [place] in the Last Year: Never true  Transportation Needs: No Transportation Needs   Lack of Transportation (Medical): No   Lack of Transportation (Non-Medical): No  Physical Activity: Sufficiently Active   Days of Exercise per Week: 4 days   Minutes of Exercise per Session: 40 min  Stress: No Stress Concern Present   Feeling of Stress : Not at all  Social Connections: Moderately Integrated   Frequency of Communication with Friends and Family: Twice a week   Frequency of Social Gatherings with Friends and Family: Twice a week   Attends Religious Services: More than 4 times per year   Active Member of [employer] or Organizations: Yes   Attends Archivist Meetings: More than 4 times per year   Marital Status: Widowed    Tobacco Counseling Counseling given: Not Answered   Clinical Intake:  Pre-visit preparation completed: Yes  Pain : No/denies pain     Nutritional Risks: None Diabetes: No  How often do you need to have someone help you when you read instructions, pamphlets, or other written materials from your doctor or pharmacy?: 1 - Never What is the last grade level you completed in school?: [employer]  Interpreter Needed?: No  Information entered by :: [place] of Daily Living In your present state of health, do you have any difficulty performing the following activities: 03/08/2021 11/06/2020  Hearing? N N  Vision? N N  Difficulty concentrating or making decisions? N N  Walking or climbing stairs? N N  Dressing or bathing? N N  Doing errands, shopping? N N  Preparing  Food and eating ? N -  Using the Toilet? N -  In the past six months, have you accidently leaked urine? N -  Do you have problems with loss of bowel control? N -  Managing your Medications? N -  Managing your Finances? N -  Housekeeping or managing your Housekeeping? N -  Some recent data might be hidden    Patient Care Team: Midge Minium, MD as PCP - General (Family Medicine) Susie Cassette, [place] as Referring Physician (Chiropractic Medicine)  Indicate  any recent Medical Services you may have received from other than [facility] providers in the past year (date may be approximate).     Assessment:   This is a routine wellness examination for Deanna Livingston.  Hearing/Vision screen Vision Screening - Comments:: Annual eye exams wear glasses   Dietary issues and exercise activities discussed: Current Exercise Habits: Home exercise routine, Type of exercise: walking, Time (Minutes): 40, Frequency (Times/Week): 3, Weekly Exercise (Minutes/Week): 120, Intensity: Mild, Exercise limited by: orthopedic condition(s)   Goals Addressed   None    Depression Screen PHQ 2/9 Scores 03/08/2021 03/08/2021 11/06/2020 09/13/2020 05/08/2020 11/08/2019 10/26/2019  PHQ - 2 Score 0 0 0 0 0 0 0  PHQ- 9 Score - - 0 0 0 - -    Fall Risk Fall Risk  03/08/2021 11/06/2020 09/13/2020 05/08/2020 11/08/2019  Falls in the past year? 0 0 0 0 0  Comment - - - - -  Number falls in past yr: 0 0 0 0 0  Injury with Fall? 0 0 0 0 0  Risk for fall due to : - No Fall Risks No Fall Risks No Fall Risks -  Follow up Falls evaluation completed - - - Falls evaluation completed    FALL RISK PREVENTION PERTAINING TO THE HOME:  Any stairs in or around the home? Yes  If so, are there any without handrails? No  Home free of loose throw rugs in walkways, pet beds, electrical cords, etc? Yes  Adequate lighting in your home to reduce risk of falls? Yes   ASSISTIVE DEVICES UTILIZED TO PREVENT FALLS:  Life alert? No  Use of a cane,  walker or w/c? No  Grab bars in the bathroom? No  Shower chair or bench in shower? No  Elevated toilet seat or a handicapped toilet? No    Cognitive Function: Normal cognitive status assessed by direct observation by this Nurse Health Advisor. No abnormalities found.   MMSE - Mini Mental State Exam 11/04/2018 10/29/2017  Orientation to time 5 5  Orientation to Place 5 5  Registration 3 3  Attention/ Calculation 5 5  Recall 3 3  Language- name 2 objects 2 2  Language- repeat 1 1  Language- follow 3 step command 3 3  Language- read & follow direction 1 1  Write a sentence 1 1  Copy design 1 1  Total score 30 30     6CIT Screen 10/26/2019  What Year? 0 points  What month? 0 points  What time? 0 points  Count back from 20 0 points  Months in reverse 0 points  Repeat phrase 0 points  Total Score 0    Immunizations Immunization History  Administered Date(s) Administered   Moderna Sars-Covid-2 Vaccination 11/22/2019, 12/20/2019   Pneumococcal Conjugate-13 04/28/2017   Pneumococcal Polysaccharide-23 05/04/2018   Tdap 10/07/2011    TDAP status: Up to date  Flu Vaccine status: Declined, Education has been provided regarding the importance of this vaccine but patient still declined. Advised may receive this vaccine at local pharmacy or Health Dept. Aware to provide a copy of the vaccination record if obtained from local pharmacy or Health Dept. Verbalized acceptance and understanding.  Pneumococcal vaccine status: Up to date  Covid-19 vaccine status: Completed vaccines  Qualifies for Shingles Vaccine? Yes   Zostavax completed No   Shingrix Completed?: No.    Education has been provided regarding the importance of this vaccine. Patient has been advised to call insurance company to determine out of pocket expense if  they have not yet received this vaccine. Advised may also receive vaccine at local pharmacy or Health Dept. Verbalized acceptance and understanding.  Screening  Tests Health Maintenance  Topic Date Due   Hepatitis C Screening  Never done   Zoster Vaccines- Shingrix (1 of 2) Never done   INFLUENZA VACCINE  Never done   COVID-19 Vaccine (3 - Booster for Moderna series) 04/21/2021 (Originally 02/14/2020)   MAMMOGRAM  06/20/2021   DEXA SCAN  08/03/2021   TETANUS/TDAP  10/06/2021   COLONOSCOPY (Pts 45-19yrs Insurance coverage will need to be confirmed)  06/19/2022   Pneumonia Vaccine 25+ Years old  Completed   HPV VACCINES  Aged Out    Health Maintenance  Health Maintenance Due  Topic Date Due   Hepatitis C Screening  Never done   Zoster Vaccines- Shingrix (1 of 2) Never done   INFLUENZA VACCINE  Never done    Colorectal cancer screening: Type of screening: Colonoscopy. Completed 06/19/2017. Repeat every 5 years  Mammogram status: Completed 06/20/2020. Repeat every year  Bone Density status: Completed 08/04/2019. Results reflect: Bone density results: OSTEOPOROSIS. Repeat every 2 years.  Lung Cancer Screening: (Low Dose CT Chest recommended if Age 67-80 years, 30 pack-year currently smoking OR have quit w/in 15years.) does not qualify.   Lung Cancer Screening Referral: n/a  Additional Screening:  Hepatitis C Screening: does qualify;   Vision Screening: Recommended annual ophthalmology exams for early detection of glaucoma and other disorders of the eye. Is the patient up to date with their annual eye exam?  Yes  Who is the provider or what is the name of the office in which the patient attends annual eye exams? [facility]  If pt is not established with a provider, would they like to be referred to a provider to establish care? No .   Dental Screening: Recommended annual dental exams for proper oral hygiene  Community Resource Referral / Chronic Care Management: CRR required this visit?  No   CCM required this visit?  No      Plan:     I have personally reviewed and noted the following in the patient's chart:   Medical  and social history Use of alcohol, tobacco or illicit drugs  Current medications and supplements including opioid prescriptions.  Functional ability and status Nutritional status Physical activity Advanced directives List of other physicians Hospitalizations, surgeries, and ER visits in previous 12 months Vitals Screenings to include cognitive, depression, and falls Referrals and appointments  In addition, I have reviewed and discussed with patient certain preventive protocols, quality metrics, and best practice recommendations. A written personalized care plan for preventive services as well as general preventive health recommendations were provided to patient.     Randel Pigg, LPN   60/63/0160   Nurse Notes: none

## 2021-05-09 ENCOUNTER — Ambulatory Visit (INDEPENDENT_AMBULATORY_CARE_PROVIDER_SITE_OTHER): Payer: Medicare PPO | Admitting: Family Medicine

## 2021-05-09 ENCOUNTER — Encounter: Payer: Self-pay | Admitting: Family Medicine

## 2021-05-09 VITALS — BP 126/82 | HR 67 | Temp 98.5°F | Resp 16 | Wt 203.8 lb

## 2021-05-09 DIAGNOSIS — H6123 Impacted cerumen, bilateral: Secondary | ICD-10-CM

## 2021-05-09 DIAGNOSIS — Z6834 Body mass index (BMI) 34.0-34.9, adult: Secondary | ICD-10-CM | POA: Diagnosis not present

## 2021-05-09 DIAGNOSIS — E785 Hyperlipidemia, unspecified: Secondary | ICD-10-CM

## 2021-05-09 DIAGNOSIS — E6609 Other obesity due to excess calories: Secondary | ICD-10-CM

## 2021-05-09 LAB — CBC WITH DIFFERENTIAL/PLATELET
Basophils Absolute: 0.1 10*3/uL (ref 0.0–0.1)
Basophils Relative: 0.8 % (ref 0.0–3.0)
Eosinophils Absolute: 0.1 10*3/uL (ref 0.0–0.7)
Eosinophils Relative: 1.5 % (ref 0.0–5.0)
HCT: 37 % (ref 36.0–46.0)
Hemoglobin: 12.2 g/dL (ref 12.0–15.0)
Lymphocytes Relative: 26.4 % (ref 12.0–46.0)
Lymphs Abs: 1.8 10*3/uL (ref 0.7–4.0)
MCHC: 33.1 g/dL (ref 30.0–36.0)
MCV: 88.3 fl (ref 78.0–100.0)
Monocytes Absolute: 0.6 10*3/uL (ref 0.1–1.0)
Monocytes Relative: 9 % (ref 3.0–12.0)
Neutro Abs: 4.3 10*3/uL (ref 1.4–7.7)
Neutrophils Relative %: 62.3 % (ref 43.0–77.0)
Platelets: 250 10*3/uL (ref 150.0–400.0)
RBC: 4.19 Mil/uL (ref 3.87–5.11)
RDW: 13.8 % (ref 11.5–15.5)
WBC: 7 10*3/uL (ref 4.0–10.5)

## 2021-05-09 LAB — LIPID PANEL
Cholesterol: 204 mg/dL — ABNORMAL HIGH (ref 0–200)
HDL: 40.5 mg/dL (ref >39.00)
LDL Cholesterol: 141 mg/dL — ABNORMAL HIGH (ref 0–99)
NonHDL: 163.77
Total CHOL/HDL Ratio: 5
Triglycerides: 112 mg/dL (ref 0.0–149.0)
VLDL: 22.4 mg/dL (ref 0.0–40.0)

## 2021-05-09 LAB — HEPATIC FUNCTION PANEL
ALT: 12 U/L (ref 0–35)
AST: 15 U/L (ref 0–37)
Albumin: 3.9 g/dL (ref 3.5–5.2)
Alkaline Phosphatase: 57 U/L (ref 39–117)
Bilirubin, Direct: 0.1 mg/dL (ref 0.0–0.3)
Total Bilirubin: 0.7 mg/dL (ref 0.2–1.2)
Total Protein: 7.1 g/dL (ref 6.0–8.3)

## 2021-05-09 LAB — BASIC METABOLIC PANEL
BUN: 8 mg/dL (ref 6–23)
CO2: 28 mEq/L (ref 19–32)
Calcium: 10 mg/dL (ref 8.4–10.5)
Chloride: 106 mEq/L (ref 96–112)
Creatinine, Ser: 0.79 mg/dL (ref 0.40–1.20)
GFR: 75.95 mL/min (ref >60.00)
Glucose, Bld: 72 mg/dL (ref 70–99)
Potassium: 4 mEq/L (ref 3.5–5.1)
Sodium: 139 mEq/L (ref 135–145)

## 2021-05-09 LAB — TSH: TSH: 1.74 u[IU]/mL (ref 0.35–5.50)

## 2021-05-09 NOTE — Patient Instructions (Addendum)
Schedule your complete physical in 6 months We'll notify you of your lab results and make any changes if needed Saturate a cotton ball w/ peroxide and drip it into the ear to dissolve any wax weekly Continue to work on healthy diet and regular exercise- you can do it! Call with any questions or concerns Stay Safe!  Stay Healthy! Happy New Year!

## 2021-05-09 NOTE — Assessment & Plan Note (Signed)
Chronic problem.  Currently taking Red Yeast Rice and attempting to control w/ diet and exercise.  Check labs and adjust meds prn.

## 2021-05-09 NOTE — Assessment & Plan Note (Signed)
Ongoing issue for pt.  BMI 34.98  She has been limited in her exercise recently due to foot surgery but has resumed regular line dancing.  Encouraged healthy diet and regular physical activity.  Will follow.

## 2021-05-09 NOTE — Progress Notes (Signed)
° °  Subjective:    Patient ID: Deanna Livingston, female    DOB: 1951-01-27, 71 y.o.   MRN: [id number]  HPI Hyperlipidemia- ongoing issue for pt.  Last LDL 125.  Currently on Red Yeast Rice.  Denies CP, SOB, abd pain, N/V.  Cerumen- bilateral, pt reports she is cleaning ears regularly but will have build up almost immediately.  Denies itching.    Obesity- ongoing issue for pt.  BMi 34.98  Pt is line dancing regularly.  Has been out of her routine recently due to foot surgery.   Review of Systems For ROS see HPI   This visit occurred during the SARS-CoV-2 public health emergency.  Safety protocols were in place, including screening questions prior to the visit, additional usage of staff PPE, and extensive cleaning of exam room while observing appropriate contact time as indicated for disinfecting solutions.      Objective:   Physical Exam Vitals reviewed.  Constitutional:      General: She is not in acute distress.    Appearance: Normal appearance. She is well-developed. She is obese. She is not ill-appearing.  HENT:     Head: Normocephalic and atraumatic.     Right Ear: There is impacted cerumen.     Left Ear: There is impacted cerumen.     Ears:     Comments: After pt consent, ears were successfully irrigated and pt tolerated procedure w/o difficulty Eyes:     Conjunctiva/sclera: Conjunctivae normal.     Pupils: Pupils are equal, round, and reactive to light.  Neck:     Thyroid: No thyromegaly.  Cardiovascular:     Rate and Rhythm: Normal rate and regular rhythm.     Pulses: Normal pulses.     Heart sounds: Normal heart sounds. No murmur heard. Pulmonary:     Effort: Pulmonary effort is normal. No respiratory distress.     Breath sounds: Normal breath sounds.  Abdominal:     General: There is no distension.     Palpations: Abdomen is soft.     Tenderness: There is no abdominal tenderness.  Musculoskeletal:     Cervical back: Normal range of motion and neck supple.      Right lower leg: No edema.     Left lower leg: No edema.  Lymphadenopathy:     Cervical: No cervical adenopathy.  Skin:    General: Skin is warm and dry.  Neurological:     Mental Status: She is alert and oriented to person, place, and time.  Psychiatric:        Behavior: Behavior normal.          Assessment & Plan:   Bilateral cerumen impaction- new.  Pt consented to irrigation and wax was successfully irrigated.  Pt tolerated w/o difficulty and was pleased w/ immediate relief.

## 2021-05-16 ENCOUNTER — Other Ambulatory Visit: Payer: Self-pay | Admitting: Family Medicine

## 2021-05-16 DIAGNOSIS — Z1231 Encounter for screening mammogram for malignant neoplasm of breast: Secondary | ICD-10-CM

## 2021-06-21 ENCOUNTER — Ambulatory Visit
Admission: RE | Admit: 2021-06-21 | Discharge: 2021-06-21 | Disposition: A | Discharge status: Home / Self Care | Payer: Medicare PPO | Source: Ambulatory Visit | Attending: Family Medicine | Admitting: Family Medicine

## 2021-06-21 DIAGNOSIS — Z1231 Encounter for screening mammogram for malignant neoplasm of breast: Secondary | ICD-10-CM | POA: Diagnosis not present

## 2021-07-18 ENCOUNTER — Ambulatory Visit: Payer: Medicare Other | Admitting: Endocrinology

## 2021-07-26 ENCOUNTER — Ambulatory Visit: Payer: Medicare Other | Admitting: Endocrinology

## 2021-08-06 ENCOUNTER — Telehealth: Payer: Self-pay | Admitting: Family Medicine

## 2021-08-06 DIAGNOSIS — M8580 Other specified disorders of bone density and structure, unspecified site: Secondary | ICD-10-CM

## 2021-08-06 MED ORDER — ALENDRONATE SODIUM 70 MG PO TABS: 70.0000 mg | ORAL_TABLET | ORAL | 3 refills | Status: AC

## 2021-08-06 NOTE — Telephone Encounter (Signed)
Pt called in asking if she should still be taking the Fosamax, if so she need refills sent to [facility] on [address]. Ok to [facility] if she doesn't answer the phone  ? ?Please advise  ?

## 2021-08-06 NOTE — Telephone Encounter (Signed)
Prescription sent to pharmacy as pt is to continue this weekly for up to 5 yrs ?

## 2021-08-07 NOTE — Telephone Encounter (Signed)
Patient aware, states she will pick it up today ?

## 2021-08-15 ENCOUNTER — Ambulatory Visit (INDEPENDENT_AMBULATORY_CARE_PROVIDER_SITE_OTHER): Payer: Medicare PPO | Admitting: Endocrinology

## 2021-08-15 LAB — VITAMIN D 25 HYDROXY (VIT D DEFICIENCY, FRACTURES): VITD: 50.35 ng/mL (ref 30.00–100.00)

## 2021-08-15 NOTE — Patient Instructions (Addendum)
Blood tests are requested for you today.  We'll let you know about the results.  ?If these tests are the same, no treatment is needed, and we should keep an eye on it.  ?Please come back for a follow-up appointment in 6 months.   ?

## 2021-08-15 NOTE — Progress Notes (Signed)
? ?Subjective:  ? ? Patient ID: Deanna Livingston, female    DOB: Nov 27, 1950, 71 y.o.   MRN: [id number] ? ?HPI ?Pt returns for f/u of hypercalcemia (dx'ed 2013; PTH and Ca++ have both been intermitt elevated; she has never had urolithiasis or bony fracture; other w/u of hypercalcemia was neg; she takes alendronate); she is off Vit-D supplement.  pt states she feels well in general.   ?Past Medical History:  ?Diagnosis Date  ? GERD (gastroesophageal reflux disease)   ? Personal history of colonic polyps-adenoma 01/21/2012  ? 12/2011 5 mm adenoma and 3 mm polyp not recovered - repeat colon about 01/2017  ? Thyroid disease   ? ? ?Past Surgical History:  ?Procedure Laterality Date  ? [place]  ? COLONOSCOPY  2013  ? EYE SURGERY  02/04/2020  ? left eye  ? EYE SURGERY  02/16/2020  ? rt eye  ? FOOT SURGERY    ? left arm lipoma removed    ? ? ?Social History  ? ?Socioeconomic History  ? Marital status: Widowed  ?  Spouse name: Not on file  ? Number of children: Not on file  ? Years of education: Not on file  ? Highest education level: Not on file  ?Occupational History  ? Occupation: Retired  ?Tobacco Use  ? Smoking status: Never  ? Smokeless tobacco: Never  ?Vaping Use  ? Vaping Use: Never used  ?Substance and Sexual Activity  ? Alcohol use: No  ? Drug use: No  ? Sexual activity: Not on file  ?Other Topics Concern  ? Not on file  ?Social History Narrative  ? Not on file  ? ?Social Determinants of Health  ? ?Financial Resource Strain: Low Risk   ? Difficulty of Paying Living Expenses: Not hard at all  ?Food Insecurity: No Food Insecurity  ? Worried About Charity fundraiser in the Last Year: Never true  ? Ran Out of Food in the Last Year: Never true  ?Transportation Needs: No Transportation Needs  ? Lack of Transportation (Medical): No  ? Lack of Transportation (Non-Medical): No  ?Physical Activity: Sufficiently Active  ? Days of Exercise per Week: 4 days  ? Minutes of Exercise per Session: 40 min  ?Stress:  No Stress Concern Present  ? Feeling of Stress : Not at all  ?Social Connections: Moderately Integrated  ? Frequency of Communication with Friends and Family: Twice a week  ? Frequency of Social Gatherings with Friends and Family: Twice a week  ? Attends Religious Services: More than 4 times per year  ? Active Member of Clubs or Organizations: Yes  ? Attends Archivist Meetings: More than 4 times per year  ? Marital Status: Widowed  ?Intimate Partner Violence: Not At Risk  ? Fear of Current or Ex-Partner: No  ? Emotionally Abused: No  ? Physically Abused: No  ? Sexually Abused: No  ? ? ?Current Outpatient Medications on File Prior to Visit  ?Medication Sig Dispense Refill  ? Acetylcysteine (NAC) 500 MG CAPS Take by mouth.    ? alendronate (FOSAMAX) 70 MG tablet Take 1 tablet (70 mg total) by mouth every 7 (seven) days. 12 tablet 3  ? Ascorbic Acid (VITAMIN C PO) Take by mouth.    ? Cod Liver Oil CAPS Take 1 capsule by mouth daily.    ? Fluocinolone Acetonide 0.01 % OIL Place 5 drops in ear(s) 2 (two) times daily. Use for 7-10 days.  Repeat as needed 20  mL 0  ? GARLIC PO Take 1 capsule by mouth daily.    ? Ginkgo Biloba 40 MG TABS Take by mouth.    ? MAGNESIUM PO Take by mouth.    ? Red Yeast Rice 500 MG/0.5GM POWD Take 500 mg by mouth.    ? TURMERIC PO Take by mouth.    ? vitamin B-12 (CYANOCOBALAMIN) 500 MCG tablet Take 500 mcg by mouth daily. Patient reported taking 2 a day.    ? Zinc 25 MG TABS Take by mouth.    ? ?No current facility-administered medications on file prior to visit.  ? ? ?Allergies  ?Allergen Reactions  ? Naproxen Other (See Comments)  ?  Makes her feel "crazy"  ? ? ?Family History  ?Problem Relation Age of Onset  ? Colon cancer Mother   ? Heart disease Mother   ? Hypertension Mother   ? Heart disease Father   ? Hypertension Father   ? Diabetes Father   ? Lupus Sister   ? Cancer Other   ? Stomach cancer Neg Hx   ? Rectal cancer Neg Hx   ? Colon polyps Neg Hx   ? Breast cancer Neg Hx   ?  Hypercalcemia Neg Hx   ? ? ?BP 124/80 (BP Location: Left Arm, Patient Position: Sitting, Cuff Size: Normal)   Pulse 66   Ht '5\' 4"'$  (1.626 m)   Wt 206 lb 6.4 oz (93.6 kg)   SpO2 99%   BMI 35.43 kg/m?  ? ?Review of Systems ? ?   ?Objective:  ? Physical Exam ?VITAL SIGNS:  See vs page.  ?GENERAL: no distress.  ? ? ?Lab Results  ?Component Value Date  ? PTH 74 08/15/2021  ? CALCIUM 10.1 08/15/2021  ? ?25-OH Vit-D=50 ?   ?Assessment & Plan:  ?Hyperparathyroidism: prob a combination of primary and secondary.  stable now, but she is at risk of recurrence.   ?She should stay off Vit-D supplement. ? ? ? ?

## 2021-08-16 LAB — PTH, INTACT AND CALCIUM
Calcium: 10.1 mg/dL (ref 8.6–10.4)
PTH: 74 pg/mL (ref 16–77)

## 2021-11-08 ENCOUNTER — Ambulatory Visit (INDEPENDENT_AMBULATORY_CARE_PROVIDER_SITE_OTHER): Payer: Medicare PPO | Admitting: Family Medicine

## 2021-11-08 ENCOUNTER — Encounter: Payer: Self-pay | Admitting: Family Medicine

## 2021-11-08 VITALS — BP 128/82 | HR 70 | Temp 98.1°F | Resp 16 | Ht 63.0 in | Wt 202.5 lb

## 2021-11-08 DIAGNOSIS — M810 Age-related osteoporosis without current pathological fracture: Secondary | ICD-10-CM

## 2021-11-08 DIAGNOSIS — E669 Obesity, unspecified: Secondary | ICD-10-CM | POA: Diagnosis not present

## 2021-11-08 DIAGNOSIS — Z0000 Encounter for general adult medical examination without abnormal findings: Secondary | ICD-10-CM | POA: Diagnosis not present

## 2021-11-08 DIAGNOSIS — M8580 Other specified disorders of bone density and structure, unspecified site: Secondary | ICD-10-CM | POA: Diagnosis not present

## 2021-11-08 LAB — CBC WITH DIFFERENTIAL/PLATELET
Basophils Absolute: 0.1 10*3/uL (ref 0.0–0.1)
Basophils Relative: 0.8 % (ref 0.0–3.0)
Eosinophils Absolute: 0.1 10*3/uL (ref 0.0–0.7)
Eosinophils Relative: 1.5 % (ref 0.0–5.0)
HCT: 34.1 % — ABNORMAL LOW (ref 36.0–46.0)
Hemoglobin: 11.2 g/dL — ABNORMAL LOW (ref 12.0–15.0)
Lymphocytes Relative: 27.3 % (ref 12.0–46.0)
Lymphs Abs: 2 10*3/uL (ref 0.7–4.0)
MCHC: 32.9 g/dL (ref 30.0–36.0)
MCV: 88.2 fl (ref 78.0–100.0)
Monocytes Absolute: 0.7 10*3/uL (ref 0.1–1.0)
Monocytes Relative: 9.4 % (ref 3.0–12.0)
Neutro Abs: 4.4 10*3/uL (ref 1.4–7.7)
Neutrophils Relative %: 61 % (ref 43.0–77.0)
Platelets: 251 10*3/uL (ref 150.0–400.0)
RBC: 3.86 Mil/uL — ABNORMAL LOW (ref 3.87–5.11)
RDW: 14.1 % (ref 11.5–15.5)
WBC: 7.3 10*3/uL (ref 4.0–10.5)

## 2021-11-08 LAB — HEPATIC FUNCTION PANEL
ALT: 12 U/L (ref 0–35)
AST: 20 U/L (ref 0–37)
Albumin: 4.2 g/dL (ref 3.5–5.2)
Alkaline Phosphatase: 53 U/L (ref 39–117)
Bilirubin, Direct: 0.1 mg/dL (ref 0.0–0.3)
Total Bilirubin: 0.5 mg/dL (ref 0.2–1.2)
Total Protein: 7.5 g/dL (ref 6.0–8.3)

## 2021-11-08 LAB — LIPID PANEL
Cholesterol: 206 mg/dL — ABNORMAL HIGH (ref 0–200)
HDL: 45.7 mg/dL (ref >39.00)
LDL Cholesterol: 147 mg/dL — ABNORMAL HIGH (ref 0–99)
NonHDL: 160.18
Total CHOL/HDL Ratio: 5
Triglycerides: 65 mg/dL (ref 0.0–149.0)
VLDL: 13 mg/dL (ref 0.0–40.0)

## 2021-11-08 LAB — BASIC METABOLIC PANEL
BUN: 16 mg/dL (ref 6–23)
CO2: 27 mEq/L (ref 19–32)
Calcium: 10.6 mg/dL — ABNORMAL HIGH (ref 8.4–10.5)
Chloride: 105 mEq/L (ref 96–112)
Creatinine, Ser: 0.82 mg/dL (ref 0.40–1.20)
GFR: 72.38 mL/min (ref >60.00)
Glucose, Bld: 97 mg/dL (ref 70–99)
Potassium: 3.9 mEq/L (ref 3.5–5.1)
Sodium: 138 mEq/L (ref 135–145)

## 2021-11-08 LAB — TSH: TSH: 1.78 u[IU]/mL (ref 0.35–5.50)

## 2021-11-08 LAB — VITAMIN D 25 HYDROXY (VIT D DEFICIENCY, FRACTURES): VITD: 44.05 ng/mL (ref 30.00–100.00)

## 2021-11-08 NOTE — Patient Instructions (Addendum)
Follow up in 1 year or as needed We'll notify you of your lab results and make any changes if needed Continue to work on healthy diet and regular exercise- you're doing great! We'll call you to schedule your bone density appt Call with any questions or concerns Have a great summer!  Enjoy your trip!!

## 2021-11-08 NOTE — Assessment & Plan Note (Signed)
Pt's PE WNL w/ exception of BMI.  UTD on colonoscopy, mammo, Tdap, PNA.  Check labs.  Anticipatory guidance provided.

## 2021-11-08 NOTE — Assessment & Plan Note (Signed)
Due for repeat DEXA.  Ordered

## 2021-11-08 NOTE — Assessment & Plan Note (Signed)
Ongoing issue for pt.  She is now exercising regularly at [facility] and has improved her diet.  Reports feeling 'great'.  Applauded her efforts.  Will get labs to risk stratify.  Will continue to follow.

## 2021-11-08 NOTE — Progress Notes (Signed)
   Subjective:    Patient ID: Deanna Livingston, female    DOB: 1951/04/07, 71 y.o.   MRN: [id number]  HPI CPE- UTD on colonoscopy, mammo, Tdap.  'i feel great!'  No concerns today.  Patient Care Team    Relationship Specialty Notifications Start End  Midge Minium, MD PCP - General Family Medicine  10/07/11   Susie Cassette, DC Referring Physician Chiropractic Medicine  10/29/17     Health Maintenance  Topic Date Due   DEXA SCAN  08/03/2021   COVID-19 Vaccine (3 - Moderna series) 11/24/2021 (Originally 02/14/2020)   Hepatitis C Screening  11/09/2022 (Originally 03/31/1969)   INFLUENZA VACCINE  11/20/2021   COLONOSCOPY (Pts 45-68yr Insurance coverage will need to be confirmed)  06/19/2022   MAMMOGRAM  06/22/2022   TETANUS/TDAP  09/21/2031   Pneumonia Vaccine 71 Years old  Completed   HPV VACCINES  Aged Out   Zoster Vaccines- Shingrix  Discontinued      Review of Systems Patient reports no vision/ hearing changes, adenopathy,fever, weight change,  persistant/recurrent hoarseness , swallowing issues, chest pain, palpitations, edema, persistant/recurrent cough, hemoptysis, dyspnea (rest/exertional/paroxysmal nocturnal), gastrointestinal bleeding (melena, rectal bleeding), abdominal pain, significant heartburn, bowel changes, GU symptoms (dysuria, hematuria, incontinence), Gyn symptoms (abnormal  bleeding, pain),  syncope, focal weakness, memory loss, numbness & tingling, skin/hair/nail changes, abnormal bruising or bleeding, anxiety, or depression.     Objective:   Physical Exam General Appearance:    Alert, cooperative, no distress, appears stated age, obese  Head:    Normocephalic, without obvious abnormality, atraumatic  Eyes:    PERRL, conjunctiva/corneas clear, EOM's intact both eyes  Ears:    Normal TM's and external ear canals, both ears  Nose:   Nares normal, septum midline, mucosa normal, no drainage    or sinus tenderness  Throat:   Lips, mucosa, and tongue normal;  teeth and gums normal  Neck:   Supple, symmetrical, trachea midline, no adenopathy;    Thyroid: no enlargement/tenderness/nodules  Back:     Symmetric, no curvature, ROM normal, no CVA tenderness  Lungs:     Clear to auscultation bilaterally, respirations unlabored  Chest Wall:    No tenderness or deformity   Heart:    Regular rate and rhythm, S1 and S2 normal, no murmur, rub   or gallop  Breast Exam:    Deferred to GYN  Abdomen:     Soft, non-tender, bowel sounds active all four quadrants,    no masses, no organomegaly  Genitalia:    Deferred to GYN  Rectal:    Extremities:   Extremities normal, atraumatic, no cyanosis or edema  Pulses:   2+ and symmetric all extremities  Skin:   Skin color, texture, turgor normal, no rashes or lesions  Lymph nodes:   Cervical, supraclavicular, and axillary nodes normal  Neurologic:   CNII-XII intact, normal strength, sensation and reflexes    throughout          Assessment & Plan:

## 2021-11-09 ENCOUNTER — Telehealth: Payer: Self-pay

## 2021-11-09 NOTE — Telephone Encounter (Signed)
Informed pt of lab results  

## 2021-11-09 NOTE — Telephone Encounter (Signed)
-----   Message from Midge Minium, MD sent at 11/09/2021  7:36 AM EDT ----- LDL (bad cholesterol) remains elevated.  This will improve w/ healthy diet and regular exercise.  Continue to take your Red Yeast Rice supplement daily.  Your hemoglobin (Blood Count) has dropped a little.  Please make sure you are taking a daily multivitamin w/ iron  Remainder of labs look great!

## 2021-11-30 ENCOUNTER — Telehealth: Payer: Self-pay | Admitting: Family Medicine

## 2021-11-30 NOTE — Telephone Encounter (Signed)
Caller name: Yolande Skoda   On DPR? :yes/no: Yes  Call back number: [phone]  Provider they see: Birdie Riddle   Reason for call: Pt states that Dr.Tabori told her she would take her daughter Skeet Latch) as a NP. I just need the okay to add her daughter to Dr.Tabori schedule .

## 2021-11-30 NOTE — Telephone Encounter (Signed)
Ok to schedule patient

## 2021-11-30 NOTE — Telephone Encounter (Signed)
Ok to establish 

## 2021-11-30 NOTE — Telephone Encounter (Signed)
Looking for confirmation this pt can see you as new patient

## 2021-12-11 ENCOUNTER — Encounter: Payer: Self-pay | Admitting: Family Medicine

## 2021-12-11 ENCOUNTER — Ambulatory Visit (INDEPENDENT_AMBULATORY_CARE_PROVIDER_SITE_OTHER): Payer: Medicare PPO | Admitting: Family Medicine

## 2021-12-11 VITALS — BP 120/74 | HR 80 | Temp 97.5°F | Ht 63.0 in | Wt 202.0 lb

## 2021-12-11 DIAGNOSIS — M25561 Pain in right knee: Secondary | ICD-10-CM

## 2021-12-11 MED ORDER — MELOXICAM 15 MG PO TABS: 15.0000 mg | ORAL_TABLET | Freq: Every day | ORAL | 0 refills | Status: AC

## 2021-12-11 NOTE — Patient Instructions (Signed)
Avoid activities that worsen pain (such as line dancing, weight lifting, etc.) for 1 week Put heat to knee or do hot soaks each evening. Take meloxicam 15 mg daily for 7 days

## 2021-12-11 NOTE — Progress Notes (Signed)
[facility] [address] [place] [address] Dept: [phone] Dept Fax: [phone]  Office Visit  Subjective:    Patient ID: Deanna Livingston, female    DOB: 1950/11/15, 71 y.o..   MRN: [id number]  Chief Complaint  Patient presents with   Acute Visit    C/o having Rt knee pain x 4 days.  No OTC meds.     History of Present Illness:  Patient is in today for evaluation of a 4-day history of right knee pain. Deanna Livingston is normally very active, enjoying engaging in line dancing and gym workouts. She notes that on Sat., she went on a trip with family. She sat for a 2 1/2 hour ride in the rear passenger seat of her truck. On arriving home, she had acute pain in her anteromedial right knee. This has continued to give her some pain and stiffness. she does get some improvement after she has been up and around walking for a few minutes. She denies any prior trauma to the knee. She tried rubbing this with castor oil, without significant improvement.  Past Medical History: Patient Active Problem List   Diagnosis Date Noted   Hypercalcemia 11/06/2020   Hyperlipidemia 05/07/2019   Left hip pain 04/28/2017   Osteoporosis 11/13/2015   Routine general medical examination at a health care facility 04/29/2013   Personal history of colonic polyps-adenoma 01/21/2012   Screening for malignant neoplasm of cervix 11/21/2011   Thyroid disorder 10/07/2011   Obesity (BMI 30-39.9) 10/07/2011   GERD (gastroesophageal reflux disease) 10/07/2011   Epidermal inclusion cyst 10/07/2011   Past Surgical History:  Procedure Laterality Date   CESAREAN SECTION  1982   COLONOSCOPY  2013   EYE SURGERY  02/04/2020   left eye   EYE SURGERY  02/16/2020   rt eye   FOOT SURGERY     left arm lipoma removed     Family History  Problem Relation Age of Onset   Colon cancer Mother    Heart disease Mother    Hypertension Mother    Heart disease Father     Hypertension Father    Diabetes Father    Lupus Sister    Cancer Other    Stomach cancer Neg Hx    Rectal cancer Neg Hx    Colon polyps Neg Hx    Breast cancer Neg Hx    Hypercalcemia Neg Hx    Outpatient Medications Prior to Visit  Medication Sig Dispense Refill   Acetylcysteine (NAC) 500 MG CAPS Take by mouth.     alendronate (FOSAMAX) 70 MG tablet Take 1 tablet (70 mg total) by mouth every 7 (seven) days. 12 tablet 3   Ascorbic Acid (VITAMIN C PO) Take by mouth.     Cod Liver Oil CAPS Take 1 capsule by mouth daily.     Fluocinolone Acetonide 0.01 % OIL Place 5 drops in ear(s) 2 (two) times daily. Use for 7-10 days.  Repeat as needed 20 mL 0   GARLIC PO Take 1 capsule by mouth daily.     Ginkgo Biloba 40 MG TABS Take by mouth.     MAGNESIUM PO Take by mouth.     Red Yeast Rice 500 MG/0.5GM POWD Take 500 mg by mouth.     TURMERIC PO Take by mouth.     vitamin B-12 (CYANOCOBALAMIN) 500 MCG tablet Take 500 mcg by mouth daily. Patient reported taking 2 a day.     Zinc 25  MG TABS Take by mouth.     No facility-administered medications prior to visit.   Allergies  Allergen Reactions   Naproxen Other (See Comments)    Makes her feel "crazy"   N-Acetyl-Dl-Penicillamine    Potassium-Containing Compounds      Objective:   Today's Vitals   12/11/21 1407  BP: 120/74  Pulse: 80  Temp: (!) 97.5 F (36.4 C)  TempSrc: Temporal  Weight: 202 lb (91.6 kg)  Height: '5\' 3"'$  (1.6 m)   Body mass index is 35.78 kg/m.   General: Well developed, well nourished. No acute distress. Extremities: Full ROM. Mild swelling over the anteromedial knee with some mild increased   warmth. Collateral and cruciate ligaments are intact. McMurray's testing negative.  Psych: Alert and oriented. Normal mood and affect.  Health Maintenance Due  Topic Date Due   DEXA SCAN  08/03/2021   INFLUENZA VACCINE  11/20/2021     Assessment & Plan:   1. 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 I recommend: Avoid activities that worsen pain (such as line dancing, weight lifting, etc.) for 1 week Put heat to knee or do hot soaks each evening. Take meloxicam 15 mg daily for 7 days  - meloxicam (MOBIC) 15 MG tablet; Take 1 tablet (15 mg total) by mouth daily.  Dispense: 14 tablet; Refill: 0   Return in about 10 days (around 12/21/2021), or if symptoms worsen or fail to improve.   Haydee Salter, MD

## 2021-12-13 DIAGNOSIS — R404 Transient alteration of awareness: Secondary | ICD-10-CM | POA: Diagnosis not present

## 2021-12-21 DEATH — deceased

## 2021-12-25 ENCOUNTER — Other Ambulatory Visit: Payer: PRIVATE HEALTH INSURANCE

## 2022-02-06 ENCOUNTER — Other Ambulatory Visit: Payer: Self-pay | Admitting: Endocrinology

## 2022-02-07 ENCOUNTER — Other Ambulatory Visit: Payer: PRIVATE HEALTH INSURANCE

## 2022-02-12 ENCOUNTER — Ambulatory Visit: Payer: PRIVATE HEALTH INSURANCE | Admitting: Endocrinology

## 2022-02-12 NOTE — Progress Notes (Incomplete)
Patient ID: Deanna Livingston, female   DOB: 10/01/1950, 71 y.o.   MRN: [id number]           Chief complaint: High calcium  History of Present Illness:  Referring physician:   Review of records show that she has had a high calcium since  Lab Results  Component Value Date   CALCIUM 10.6 (H) 11/08/2021   CALCIUM 10.1 08/15/2021   CALCIUM 10.0 05/09/2021   CALCIUM 11.2 (H) 01/25/2021   CALCIUM 10.1 11/06/2020   CALCIUM 10.2 05/17/2020   CALCIUM 11.2 (H) 05/11/2020   CALCIUM 10.3 11/08/2019   CALCIUM 10.3 05/07/2019    The hypercalcemia is not associated with any pathologic fractures, renal insufficiency, nephrolithiasis, sarcoidosis, known carcinoma, hyperthyroidism.   Prior serologic and radiologic studies have included:  Lab Results  Component Value Date   PTH 74 08/15/2021   CALCIUM 10.6 (H) 11/08/2021      25 (OH) Vitamin D level    Lab Results  Component Value Date   VD25OH 44.05 11/08/2021   VD25OH 50.35 08/15/2021    Prior treatment has included  Allergies as of 02/12/2022       Reactions   Naproxen Other (See Comments)   Makes her feel "crazy"   N-acetyl-dl-penicillamine    Potassium-containing Compounds         Medication List        Accurate as of February 12, 2022  8:13 AM. If you have any questions, ask your nurse or doctor.          alendronate 70 MG tablet Commonly known as: FOSAMAX Take 1 tablet (70 mg total) by mouth every 7 (seven) days.   Cod Liver Oil Caps Take 1 capsule by mouth daily.   cyanocobalamin 500 MCG tablet Commonly known as: VITAMIN B12 Take 500 mcg by mouth daily. Patient reported taking 2 a day.   Fluocinolone Acetonide 0.01 % Oil Place 5 drops in ear(s) 2 (two) times daily. Use for 7-10 days.  Repeat as needed   GARLIC PO Take 1 capsule by mouth daily.   Ginkgo Biloba 40 MG Tabs Take by mouth.   MAGNESIUM PO Take by mouth.   meloxicam 15 MG tablet Commonly known as: MOBIC Take 1 tablet (15 mg  total) by mouth daily.   NAC 500 MG Caps Generic drug: Acetylcysteine Take by mouth.   Red Yeast Rice 500 MG/0.5GM Powd Take 500 mg by mouth.   TURMERIC PO Take by mouth.   VITAMIN C PO Take by mouth.   Zinc 25 MG Tabs Take by mouth.        Allergies:  Allergies  Allergen Reactions  . Naproxen Other (See Comments)    Makes her feel "crazy"  . N-Acetyl-Dl-Penicillamine   . Potassium-Containing Compounds     Past Medical History:  Diagnosis Date  . GERD (gastroesophageal reflux disease)   . Personal history of colonic polyps-adenoma 01/21/2012   12/2011 5 mm adenoma and 3 mm polyp not recovered - repeat colon about 01/2017  . Thyroid disease     Past Surgical History:  Procedure Laterality Date  . [place]  . COLONOSCOPY  2013  . EYE SURGERY  02/04/2020   left eye  . EYE SURGERY  02/16/2020   rt eye  . FOOT SURGERY    . left arm lipoma removed      Family History  Problem Relation Age of Onset  . Colon cancer Mother   . Heart disease Mother   .  Hypertension Mother   . Heart disease Father   . Hypertension Father   . Diabetes Father   . Lupus Sister   . Cancer Other   . Stomach cancer Neg Hx   . Rectal cancer Neg Hx   . Colon polyps Neg Hx   . Breast cancer Neg Hx   . Hypercalcemia Neg Hx     Social History:  reports that she has never smoked. She has never used smokeless tobacco. She reports that she does not drink alcohol and does not use drugs.  Review of Systems   EXAM:  There were no vitals taken for this visit.  GENERAL: Averagely built and nourished  No pallor, clubbing, lymphadenopathy or edema.  Skin:  no rash or pigmentation.  EYES:  Externally normal.  Fundii:  normal discs and vessels.  ENT: Oral mucosa and tongue normal.  THYROID:  Not palpable.   HEART:  Normal  S1 and S2; no murmur or click.  CHEST:  Normal shape Lungs:   Vescicular breath sounds heard equally.  No crepitations/ wheeze.  ABDOMEN:  No  distention.  Liver and spleen not palpable.  No other mass or tenderness.  NEUROLOGICAL: .Reflexes are normal bilaterally at biceps.  SPINE AND JOINTS:  Normal.  Assessment/Plan:   HYPERCALCEMIA:  Discussed the nature of primary hyperparathyroidism as well as normal role of the parathyroid glands. Discussed potential  effects of hyperparathyroidism long-term on bone health, kidney stones and kidney function Explained to patient that surgery is indicated only there are symptoms of high calcium, calcium level over 1 point above the normal range or known osteoporosis.  Explained that if surgery is indicated this would be done after doing a parathyroid scan and most likely if the patient has single adenoma will need minimally invasive surgery   Elayne Snare 02/12/2022, 8:13 AM

## 2022-07-15 IMAGING — MG MM DIGITAL SCREENING BILAT W/ TOMO AND CAD
8 series · 8 of 24 positions shown · non-contrast
Comparison: Previous exam(s).

CLINICAL DATA: Screening.

EXAM:
DIGITAL SCREENING BILATERAL MAMMOGRAM WITH TOMOSYNTHESIS AND CAD
TECHNIQUE: Bilateral screening digital craniocaudal and mediolateral oblique
mammograms were obtained. Bilateral screening digital breast
tomosynthesis was performed. The images were evaluated with
computer-aided detection.

[L MLO synth-2D]
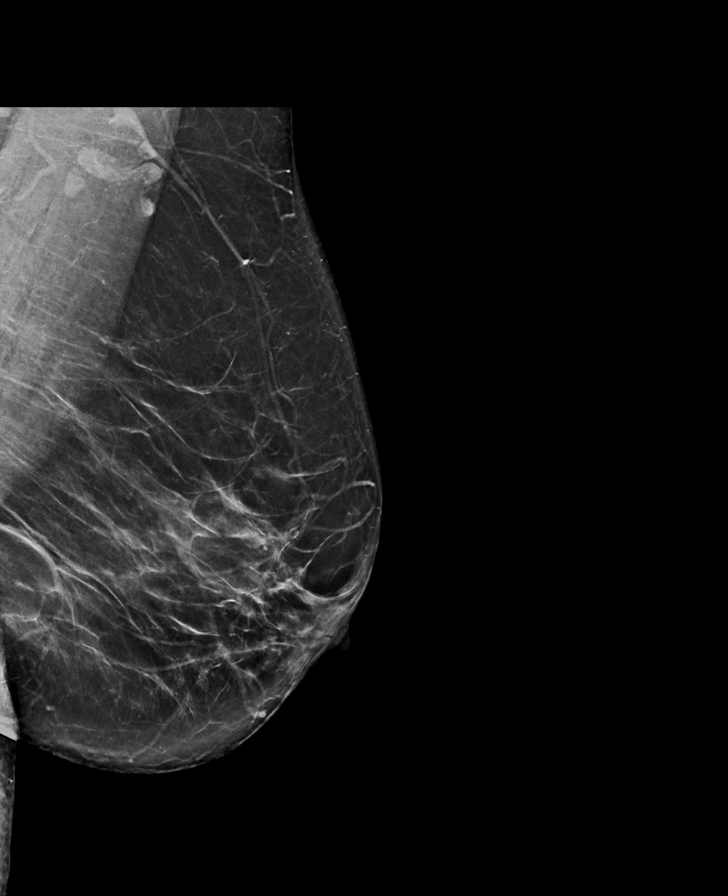

[R CC synth-2D]
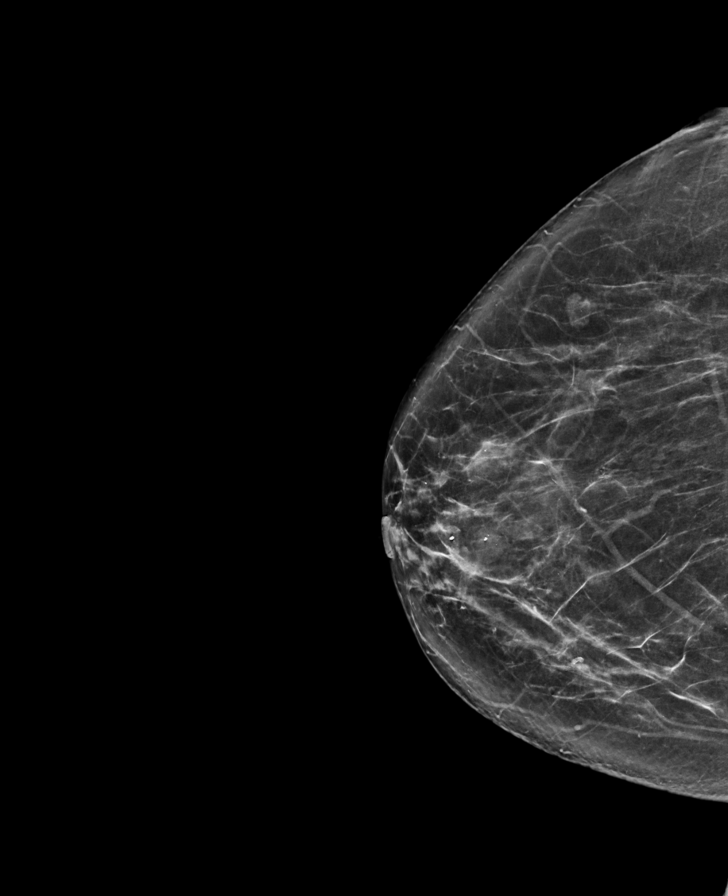

[L CC synth-2D]
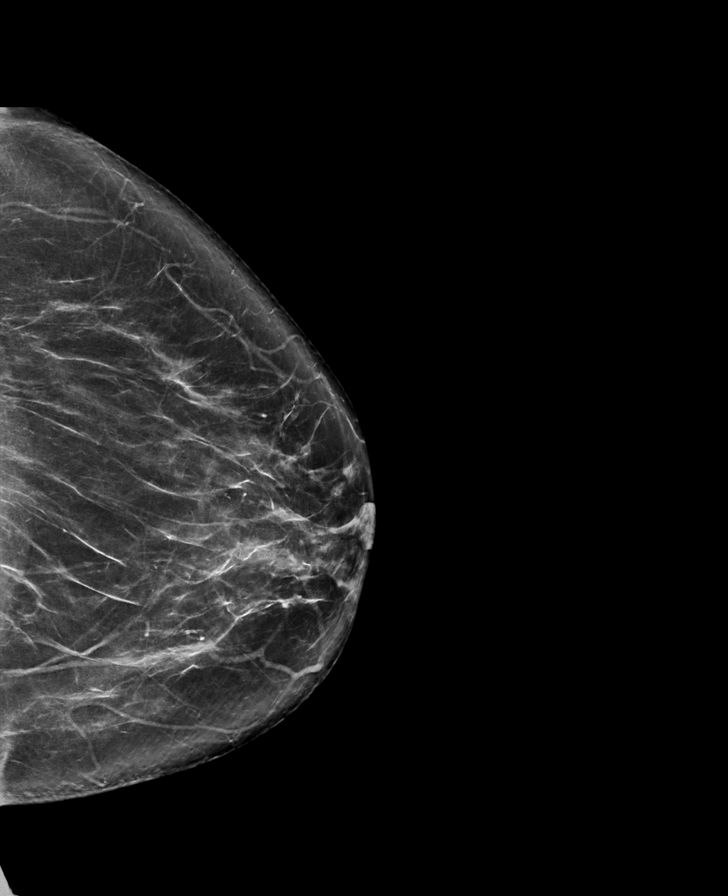

[R MLO synth-2D]
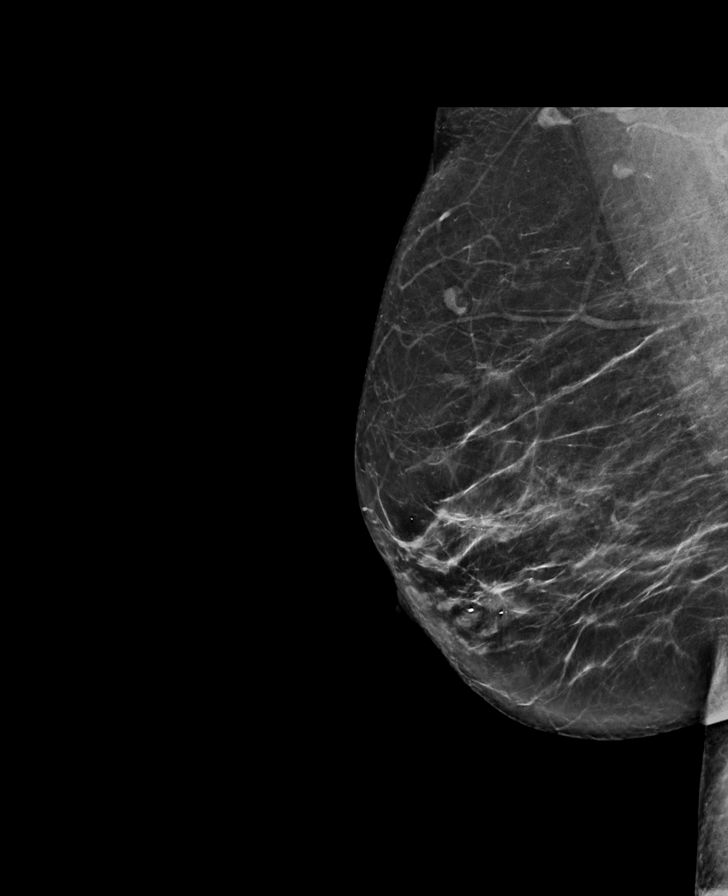

[R CC tomo · tomo slice 38/75.0]
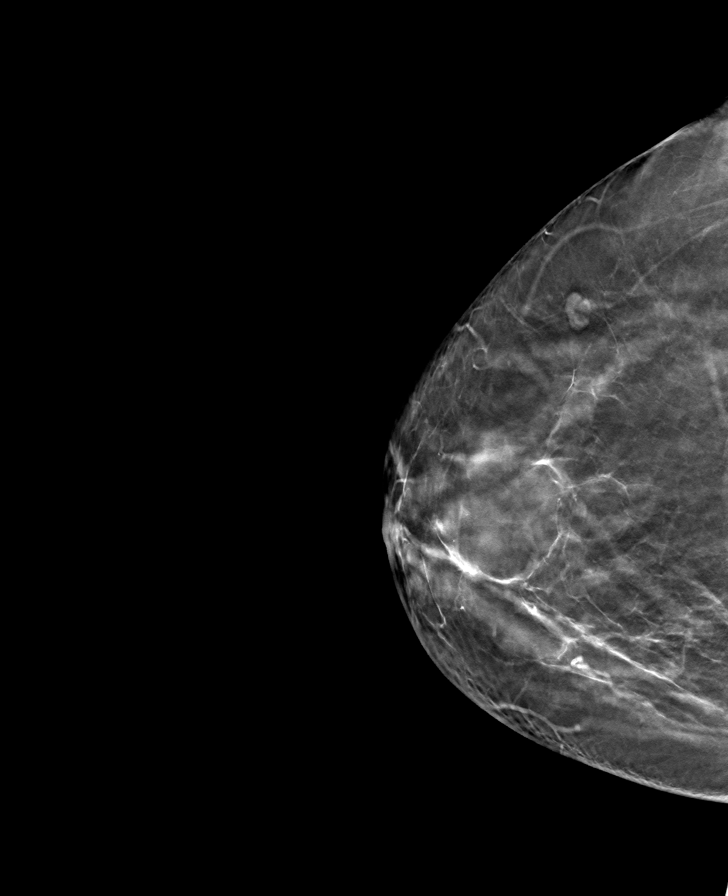

[R MLO tomo · tomo slice 39/76.0]
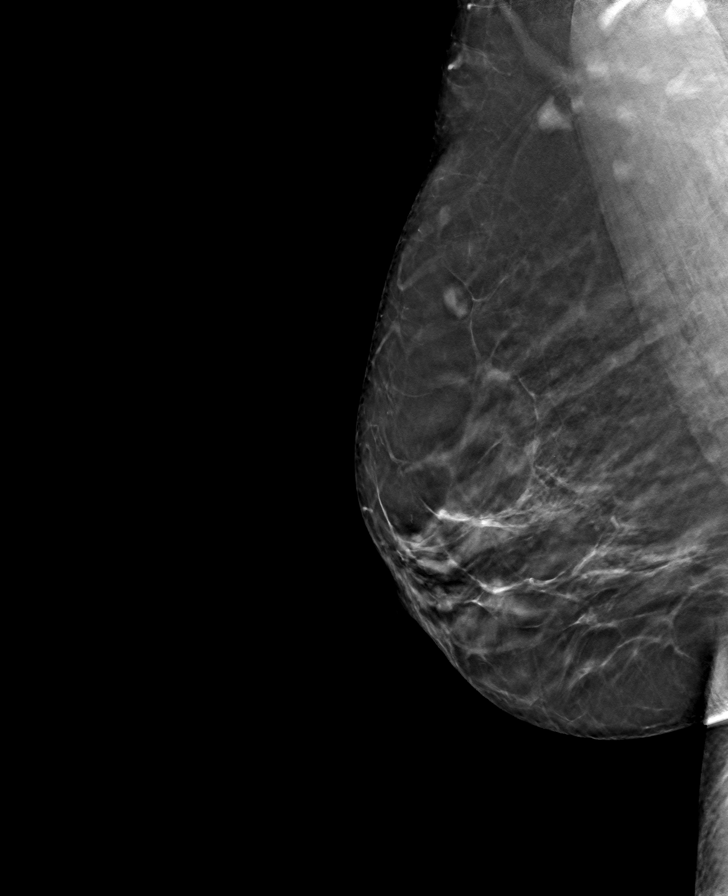

[L MLO tomo · tomo slice 42/83.0]
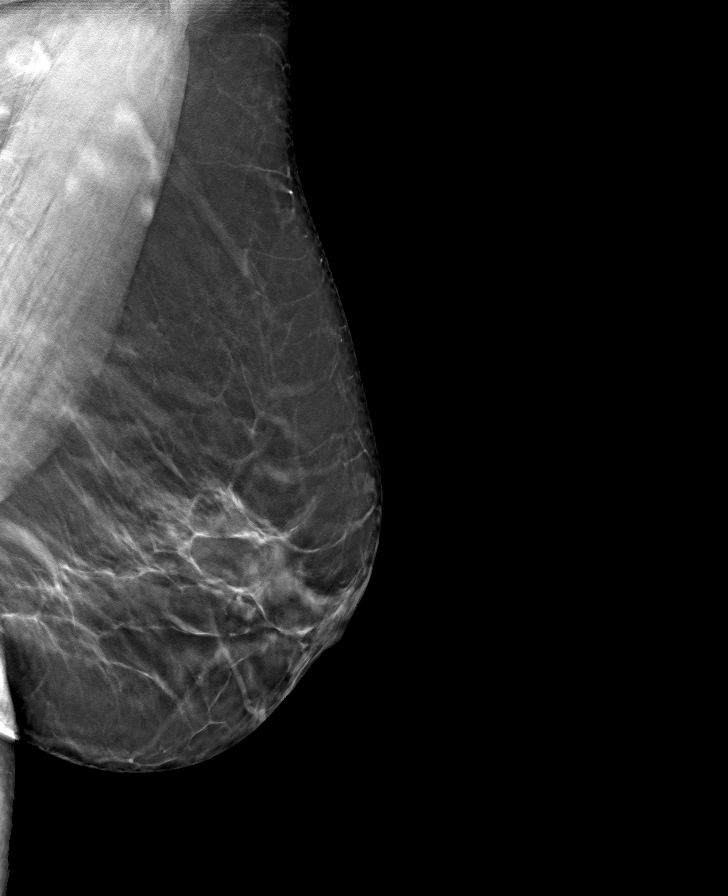

[L CC tomo · tomo slice 41/80.0]
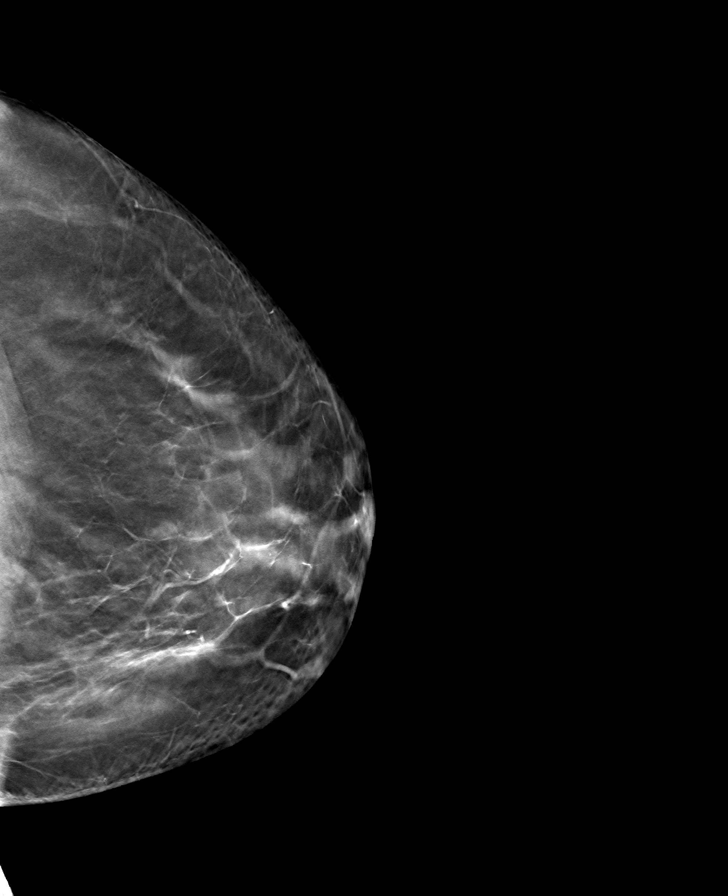

[8 of 24 positions shown; findings below may reference images not displayed]

ACR Breast Density Category b: There are scattered areas of
fibroglandular density.
FINDINGS: There are no findings suspicious for malignancy.
IMPRESSION: No mammographic evidence of malignancy. A result letter of this
screening mammogram will be mailed directly to the patient.

RECOMMENDATION:
Screening mammogram in one year. (Code:51-O-LD2)

BI-RADS CATEGORY  1: Negative.
